# Patient Record
Sex: Female | Born: 1945 | Race: White | Hispanic: No | Marital: Married | Smoking: Never smoker
Health system: Southern US, Community
[De-identification: ages and names within clinical notes are randomized; demographics above are authoritative.]

## PROBLEM LIST (undated history)

## (undated) DIAGNOSIS — M8580 Other specified disorders of bone density and structure, unspecified site: Secondary | ICD-10-CM

## (undated) DIAGNOSIS — N200 Calculus of kidney: Secondary | ICD-10-CM

## (undated) DIAGNOSIS — C50919 Malignant neoplasm of unspecified site of unspecified female breast: Secondary | ICD-10-CM

## (undated) DIAGNOSIS — C439 Malignant melanoma of skin, unspecified: Secondary | ICD-10-CM

## (undated) DIAGNOSIS — M1990 Unspecified osteoarthritis, unspecified site: Secondary | ICD-10-CM

## (undated) DIAGNOSIS — K219 Gastro-esophageal reflux disease without esophagitis: Secondary | ICD-10-CM

## (undated) DIAGNOSIS — C4491 Basal cell carcinoma of skin, unspecified: Secondary | ICD-10-CM

## (undated) DIAGNOSIS — Z9221 Personal history of antineoplastic chemotherapy: Secondary | ICD-10-CM

## (undated) HISTORY — DX: Malignant melanoma of skin, unspecified: C43.9

## (undated) HISTORY — DX: Gastro-esophageal reflux disease without esophagitis: K21.9

## (undated) HISTORY — DX: Basal cell carcinoma of skin, unspecified: C44.91

## (undated) HISTORY — PX: COLONOSCOPY W/ BIOPSIES AND POLYPECTOMY: SHX1376

## (undated) HISTORY — PX: MASTECTOMY: SHX3

## (undated) HISTORY — PX: LASIK: SHX215

## (undated) HISTORY — DX: Malignant neoplasm of unspecified site of unspecified female breast: C50.919

## (undated) HISTORY — PX: EYE SURGERY: SHX253

## (undated) HISTORY — DX: Calculus of kidney: N20.0

## (undated) HISTORY — DX: Unspecified osteoarthritis, unspecified site: M19.90

## (undated) HISTORY — DX: Other specified disorders of bone density and structure, unspecified site: M85.80

---

## 1977-09-05 HISTORY — PX: TUBAL LIGATION: SHX77

## 1986-09-05 HISTORY — PX: BREAST SURGERY: SHX581

## 1987-07-07 DIAGNOSIS — C50919 Malignant neoplasm of unspecified site of unspecified female breast: Secondary | ICD-10-CM

## 1987-07-07 HISTORY — DX: Malignant neoplasm of unspecified site of unspecified female breast: C50.919

## 1998-02-24 ENCOUNTER — Ambulatory Visit: Admission: RE | Admit: 1998-02-24 | Discharge: 1998-02-24 | Payer: Self-pay | Admitting: Oncology

## 1998-03-26 ENCOUNTER — Other Ambulatory Visit: Admission: RE | Admit: 1998-03-26 | Discharge: 1998-03-26 | Payer: Self-pay | Admitting: Obstetrics and Gynecology

## 1998-06-12 ENCOUNTER — Encounter: Payer: Self-pay | Admitting: Gastroenterology

## 1998-06-12 ENCOUNTER — Ambulatory Visit (HOSPITAL_COMMUNITY): Admission: RE | Admit: 1998-06-12 | Discharge: 1998-06-12 | Payer: Self-pay | Admitting: Gastroenterology

## 1998-09-30 ENCOUNTER — Ambulatory Visit (HOSPITAL_COMMUNITY): Admission: RE | Admit: 1998-09-30 | Discharge: 1998-09-30 | Payer: Self-pay | Admitting: Gastroenterology

## 1998-09-30 ENCOUNTER — Encounter: Payer: Self-pay | Admitting: Gastroenterology

## 1999-03-01 ENCOUNTER — Ambulatory Visit (HOSPITAL_COMMUNITY): Admission: RE | Admit: 1999-03-01 | Discharge: 1999-03-01 | Payer: Self-pay | Admitting: Oncology

## 1999-03-23 ENCOUNTER — Other Ambulatory Visit: Admission: RE | Admit: 1999-03-23 | Discharge: 1999-03-23 | Payer: Self-pay | Admitting: Obstetrics and Gynecology

## 2000-03-13 ENCOUNTER — Encounter: Admission: RE | Admit: 2000-03-13 | Discharge: 2000-03-13 | Payer: Self-pay | Admitting: Oncology

## 2000-03-13 ENCOUNTER — Encounter: Payer: Self-pay | Admitting: Oncology

## 2000-03-22 ENCOUNTER — Other Ambulatory Visit: Admission: RE | Admit: 2000-03-22 | Discharge: 2000-03-22 | Payer: Self-pay | Admitting: Obstetrics & Gynecology

## 2000-08-14 ENCOUNTER — Encounter: Payer: Self-pay | Admitting: Urology

## 2000-08-14 ENCOUNTER — Encounter: Admission: RE | Admit: 2000-08-14 | Discharge: 2000-08-14 | Payer: Self-pay | Admitting: Urology

## 2001-03-20 ENCOUNTER — Encounter: Admission: RE | Admit: 2001-03-20 | Discharge: 2001-03-20 | Payer: Self-pay | Admitting: *Deleted

## 2001-03-20 ENCOUNTER — Encounter: Payer: Self-pay | Admitting: *Deleted

## 2001-03-21 ENCOUNTER — Other Ambulatory Visit: Admission: RE | Admit: 2001-03-21 | Discharge: 2001-03-21 | Payer: Self-pay | Admitting: *Deleted

## 2002-03-21 ENCOUNTER — Encounter: Admission: RE | Admit: 2002-03-21 | Discharge: 2002-03-21 | Payer: Self-pay | Admitting: Oncology

## 2002-03-21 ENCOUNTER — Encounter: Payer: Self-pay | Admitting: Oncology

## 2002-12-05 DIAGNOSIS — C439 Malignant melanoma of skin, unspecified: Secondary | ICD-10-CM

## 2002-12-05 HISTORY — DX: Malignant melanoma of skin, unspecified: C43.9

## 2003-01-08 ENCOUNTER — Encounter (INDEPENDENT_AMBULATORY_CARE_PROVIDER_SITE_OTHER): Payer: Self-pay | Admitting: Specialist

## 2003-01-08 ENCOUNTER — Ambulatory Visit (HOSPITAL_BASED_OUTPATIENT_CLINIC_OR_DEPARTMENT_OTHER): Admission: RE | Admit: 2003-01-08 | Discharge: 2003-01-08 | Payer: Self-pay | Admitting: Plastic Surgery

## 2003-01-17 ENCOUNTER — Encounter: Payer: Self-pay | Admitting: Dermatology

## 2003-01-17 ENCOUNTER — Ambulatory Visit (HOSPITAL_COMMUNITY): Admission: RE | Admit: 2003-01-17 | Discharge: 2003-01-17 | Payer: Self-pay | Admitting: Dermatology

## 2003-02-21 ENCOUNTER — Ambulatory Visit (HOSPITAL_COMMUNITY): Admission: RE | Admit: 2003-02-21 | Discharge: 2003-02-21 | Payer: Self-pay | Admitting: Gastroenterology

## 2003-02-21 ENCOUNTER — Encounter (INDEPENDENT_AMBULATORY_CARE_PROVIDER_SITE_OTHER): Payer: Self-pay | Admitting: *Deleted

## 2003-03-24 ENCOUNTER — Encounter: Admission: RE | Admit: 2003-03-24 | Discharge: 2003-03-24 | Payer: Self-pay | Admitting: Oncology

## 2003-03-24 ENCOUNTER — Encounter: Payer: Self-pay | Admitting: Oncology

## 2003-04-21 ENCOUNTER — Other Ambulatory Visit: Admission: RE | Admit: 2003-04-21 | Discharge: 2003-04-21 | Payer: Self-pay | Admitting: Internal Medicine

## 2004-03-24 ENCOUNTER — Encounter: Admission: RE | Admit: 2004-03-24 | Discharge: 2004-03-24 | Payer: Self-pay | Admitting: Oncology

## 2004-06-28 ENCOUNTER — Other Ambulatory Visit: Admission: RE | Admit: 2004-06-28 | Discharge: 2004-06-28 | Payer: Self-pay | Admitting: Internal Medicine

## 2005-03-31 ENCOUNTER — Encounter: Admission: RE | Admit: 2005-03-31 | Discharge: 2005-03-31 | Payer: Self-pay | Admitting: Internal Medicine

## 2005-09-22 ENCOUNTER — Other Ambulatory Visit: Admission: RE | Admit: 2005-09-22 | Discharge: 2005-09-22 | Payer: Self-pay | Admitting: Internal Medicine

## 2005-11-04 ENCOUNTER — Encounter: Admission: RE | Admit: 2005-11-04 | Discharge: 2005-11-04 | Payer: Self-pay | Admitting: Internal Medicine

## 2006-01-04 ENCOUNTER — Encounter: Payer: Self-pay | Admitting: Oncology

## 2006-04-13 ENCOUNTER — Encounter: Admission: RE | Admit: 2006-04-13 | Discharge: 2006-04-13 | Payer: Self-pay | Admitting: Internal Medicine

## 2007-02-09 ENCOUNTER — Other Ambulatory Visit: Admission: RE | Admit: 2007-02-09 | Discharge: 2007-02-09 | Payer: Self-pay | Admitting: Obstetrics and Gynecology

## 2007-04-06 DIAGNOSIS — M8580 Other specified disorders of bone density and structure, unspecified site: Secondary | ICD-10-CM

## 2007-04-06 HISTORY — DX: Other specified disorders of bone density and structure, unspecified site: M85.80

## 2007-04-16 ENCOUNTER — Encounter: Admission: RE | Admit: 2007-04-16 | Discharge: 2007-04-16 | Payer: Self-pay | Admitting: Obstetrics and Gynecology

## 2007-04-20 ENCOUNTER — Encounter: Admission: RE | Admit: 2007-04-20 | Discharge: 2007-04-20 | Payer: Self-pay | Admitting: Obstetrics and Gynecology

## 2008-02-19 ENCOUNTER — Other Ambulatory Visit: Admission: RE | Admit: 2008-02-19 | Discharge: 2008-02-19 | Payer: Self-pay | Admitting: Obstetrics and Gynecology

## 2008-04-22 ENCOUNTER — Encounter: Admission: RE | Admit: 2008-04-22 | Discharge: 2008-04-22 | Payer: Self-pay | Admitting: Obstetrics and Gynecology

## 2008-09-09 ENCOUNTER — Encounter: Admission: RE | Admit: 2008-09-09 | Discharge: 2008-09-09 | Payer: Self-pay | Admitting: Orthopedic Surgery

## 2009-04-23 ENCOUNTER — Encounter: Admission: RE | Admit: 2009-04-23 | Discharge: 2009-04-23 | Payer: Self-pay | Admitting: Obstetrics and Gynecology

## 2009-04-27 ENCOUNTER — Encounter: Admission: RE | Admit: 2009-04-27 | Discharge: 2009-04-27 | Payer: Self-pay | Admitting: Obstetrics and Gynecology

## 2009-06-09 ENCOUNTER — Inpatient Hospital Stay (HOSPITAL_COMMUNITY): Admission: RE | Admit: 2009-06-09 | Discharge: 2009-06-11 | Payer: Self-pay | Admitting: Orthopedic Surgery

## 2009-07-06 HISTORY — PX: LUMBAR SPINE SURGERY: SHX701

## 2010-04-26 ENCOUNTER — Encounter: Admission: RE | Admit: 2010-04-26 | Discharge: 2010-04-26 | Payer: Self-pay | Admitting: Obstetrics and Gynecology

## 2010-09-26 ENCOUNTER — Encounter: Payer: Self-pay | Admitting: Obstetrics and Gynecology

## 2010-12-09 LAB — TYPE AND SCREEN: ABO/RH(D): A POS

## 2010-12-10 LAB — CBC
Hemoglobin: 13.2 g/dL (ref 12.0–15.0)
MCHC: 33.9 g/dL (ref 30.0–36.0)
MCV: 102.1 fL — ABNORMAL HIGH (ref 78.0–100.0)
WBC: 7.6 10*3/uL (ref 4.0–10.5)

## 2010-12-10 LAB — COMPREHENSIVE METABOLIC PANEL
ALT: 14 U/L (ref 0–35)
AST: 26 U/L (ref 0–37)
Alkaline Phosphatase: 30 U/L — ABNORMAL LOW (ref 39–117)
CO2: 27 mEq/L (ref 19–32)
Chloride: 108 mEq/L (ref 96–112)
GFR calc Af Amer: >60 mL/min (ref >60)
GFR calc non Af Amer: >60 mL/min (ref >60)
Total Protein: 7.4 g/dL (ref 6.0–8.3)

## 2010-12-10 LAB — APTT: aPTT: 25 seconds (ref 24–37)

## 2010-12-10 LAB — DIFFERENTIAL
Basophils Absolute: 0 10*3/uL (ref 0.0–0.1)
Basophils Relative: 0 % (ref 0–1)
Eosinophils Absolute: 0.1 10*3/uL (ref 0.0–0.7)
Eosinophils Relative: 1 % (ref 0–5)
Lymphocytes Relative: 26 % (ref 12–46)
Lymphs Abs: 1.9 10*3/uL (ref 0.7–4.0)
Monocytes Relative: 8 % (ref 3–12)
Neutro Abs: 5 10*3/uL (ref 1.7–7.7)

## 2010-12-10 LAB — URINALYSIS, ROUTINE W REFLEX MICROSCOPIC
Hgb urine dipstick: NEGATIVE
Ketones, ur: NEGATIVE mg/dL
Protein, ur: NEGATIVE mg/dL
Specific Gravity, Urine: 1.011 (ref 1.005–1.030)
pH: 7.5 (ref 5.0–8.0)

## 2010-12-10 LAB — URINE MICROSCOPIC-ADD ON

## 2011-01-21 NOTE — Op Note (Signed)
   NAME:  Jeanette Gonzalez, Jeanette Gonzalez                     ACCOUNT NO.:  [id number]   MEDICAL RECORD NO.:  [id number]                   PATIENT TYPE:  AMB   LOCATION:  ENDO                                 FACILITY:  [facility]   PHYSICIAN:  Petra Kuba, M.D.                 DATE OF BIRTH:  [birth year]   DATE OF PROCEDURE:  02/21/2003  DATE OF DISCHARGE:  02/21/2003                                 OPERATIVE REPORT   PROCEDURE PERFORMED:  Colonoscopy with biopsy.   ENDOSCOPIST:  Petra Kuba, M.D.   INDICATIONS FOR PROCEDURE:  Patient with history of colon polyps.  Due for  repeat screening.  Consent was signed after the risks, benefits, methods and  options were thoroughly discussed in the office.   MEDICINES USED:  Demerol 75 mg, Versed 7 mg.   DESCRIPTION OF PROCEDURE:  Rectal inspection was pertinent for external  hemorrhoids, small.  Digital exam was negative.  A video pediatric  adjustable colonoscope was inserted with some difficulty due to a tortuous  colon and was able to be advanced to the cecum.  This did require some  abdominal pressure, but no position changes.  No obvious abnormality was  seen on insertion.  The cecum was identified by the appendiceal orifice and  the ileocecal valve.  The scope was slowly withdrawn.  The prep was fairly  adequate.  She did have lots of seeds in the colon which could not be  suctioned up and occasionally clogged the scope and she was tortuous.  On  slow withdrawal through the colon no abnormalities were seen until we  withdrew back to the rectum where a tiny polyp was seen probably hypoplastic  which was cold biopsied times two.  Anorectal pull-through and retroflexion  confirmed some small hemorrhoids.  The scope was reinserted a short ways up  the left side of the colon.  Air was suctioned, scope removed.  The patient  tolerated the procedure well.  There was no immediate obvious complication.   ENDOSCOPIC DIAGNOSIS:  1. Internal and external  hemorrhoids.  2. Tiny rectal polyp, cold biopsied.  3. Tortuous colon.  4. Otherwise within normal limits to the cecum.   PLAN:  Yearly rectals and guaiacs per Dr. Delrae Alfred or gynecology.  Happy to  see back p.r.n.  Otherwise await pathology.  Probably recheck colon  screening in five years.                                               Petra Kuba, M.D.   MEM/MEDQ  D:  02/21/2003  T:  02/24/2003  Job:  [id number]   cc:   Marcene Duos, M.D.  [address] [place]  [place] [address]  Fax: [phone]

## 2011-01-21 NOTE — Op Note (Signed)
   NAME:  Jeanette Gonzalez, BONES                     ACCOUNT NO.:  [id number]   MEDICAL RECORD NO.:  [id number]                   PATIENT TYPE:  AMB   LOCATION:  DSC                                  FACILITY:  [facility]   PHYSICIAN:  Alfredia Ferguson, M.D.               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   OPERATION PERFORMED:  Wide excision with 1.5 cm margins of malignant  melanoma, right elbow.   SURGEON:  Alfredia Ferguson, M.D.   ANESTHESIA:  MAC supplemented 1% Xylocaine 1:100,000 epinephrine.   INDICATIONS FOR PROCEDURE:  The patient is a 65 year old woman with a recent  diagnosis of malignant melanoma of the right elbow area of the arm.  It is  overlying the brachioradialis muscle.  The patient wishes to undergo wide  excision.  I believe there is adequate amount of tissue to excise the area  and get a primary closure.  The potential risks of positive margins,  recurrence of the disease, infection, bleeding, unsightly scarring and  overall dissatisfaction with the results were discussed with the patient.  In spite of that she wished to proceed with the operation.   DESCRIPTION OF PROCEDURE:  After adequate intravenous analgesia had been  given.  Skin markers were placed outlining the melanoma with a minimum of  1.5 cm margins.  Local anesthesia was infiltrated and the arm was prepped  with Betadine and draped with sterile drapes.  After waiting approximately  10 minutes, an elliptical excision of the lesion down to the level of the  fascia over the brachioradialis muscle was carried out.  The  specimen was  passed off for pathology.  Hemostasis was maintained throughout using  electrocautery.  Wound edges were undermined for a distance of 1 cm in all  directions.  The wound was closed by approximating the dermis using multiple  interrupted 3-0 Monocryl sutures.  The skin was united using a running 4-0  nylon suture.  A bulky arm dressing was placed.  The estimated blood loss  was minimal.  The patient was then transferred to the recovery room in  satisfactory condition.                                                Alfredia Ferguson, M.D.    WBB/MEDQ  D:  01/08/2003  T:  01/08/2003  Job:  [id number]

## 2011-04-27 ENCOUNTER — Other Ambulatory Visit: Payer: Self-pay | Admitting: Obstetrics and Gynecology

## 2011-04-27 DIAGNOSIS — Z9011 Acquired absence of right breast and nipple: Secondary | ICD-10-CM

## 2011-04-27 DIAGNOSIS — Z780 Asymptomatic menopausal state: Secondary | ICD-10-CM

## 2011-05-03 ENCOUNTER — Ambulatory Visit
Admission: RE | Admit: 2011-05-03 | Discharge: 2011-05-03 | Disposition: A | Discharge status: Home / Self Care | Payer: BC Managed Care – PPO | Source: Ambulatory Visit | Attending: Obstetrics and Gynecology | Admitting: Obstetrics and Gynecology

## 2011-05-03 DIAGNOSIS — Z780 Asymptomatic menopausal state: Secondary | ICD-10-CM

## 2011-05-03 DIAGNOSIS — Z9011 Acquired absence of right breast and nipple: Secondary | ICD-10-CM

## 2012-06-04 ENCOUNTER — Other Ambulatory Visit: Payer: Self-pay | Admitting: Obstetrics and Gynecology

## 2012-06-04 DIAGNOSIS — Z1231 Encounter for screening mammogram for malignant neoplasm of breast: Secondary | ICD-10-CM

## 2012-06-04 DIAGNOSIS — Z9011 Acquired absence of right breast and nipple: Secondary | ICD-10-CM

## 2012-06-21 ENCOUNTER — Ambulatory Visit
Admission: RE | Admit: 2012-06-21 | Discharge: 2012-06-21 | Disposition: A | Discharge status: Home / Self Care | Payer: BC Managed Care – PPO | Source: Ambulatory Visit | Attending: Obstetrics and Gynecology | Admitting: Obstetrics and Gynecology

## 2012-06-21 DIAGNOSIS — Z1231 Encounter for screening mammogram for malignant neoplasm of breast: Secondary | ICD-10-CM

## 2012-06-21 DIAGNOSIS — Z9011 Acquired absence of right breast and nipple: Secondary | ICD-10-CM

## 2013-03-27 ENCOUNTER — Encounter: Payer: Self-pay | Admitting: Obstetrics and Gynecology

## 2013-03-27 ENCOUNTER — Ambulatory Visit (INDEPENDENT_AMBULATORY_CARE_PROVIDER_SITE_OTHER): Payer: Medicare PPO | Admitting: Obstetrics and Gynecology

## 2013-03-27 VITALS — BP 122/64 | HR 72 | Resp 18 | Ht 66.5 in | Wt 153.0 lb

## 2013-03-27 DIAGNOSIS — Z0000 Encounter for general adult medical examination without abnormal findings: Secondary | ICD-10-CM

## 2013-03-27 DIAGNOSIS — Z01419 Encounter for gynecological examination (general) (routine) without abnormal findings: Secondary | ICD-10-CM

## 2013-03-27 LAB — POCT URINALYSIS DIPSTICK
Blood, UA: NEGATIVE
Nitrite, UA: NEGATIVE
Urobilinogen, UA: NEGATIVE
pH, UA: 7

## 2013-03-27 MED ORDER — VITAMIN D (ERGOCALCIFEROL) 1.25 MG (50000 UNIT) PO CAPS: 50000.0000 [IU] | ORAL_CAPSULE | ORAL | Status: DC

## 2013-03-27 MED ORDER — MEGESTROL ACETATE 20 MG PO TABS: 20.0000 mg | ORAL_TABLET | Freq: Two times a day (BID) | ORAL | Status: DC

## 2013-03-27 NOTE — Patient Instructions (Signed)

## 2013-03-27 NOTE — Progress Notes (Signed)
67 y.o.   Married    Caucasian   female   G1P1001   here for annual exam.  Stopped her megace last winter but then hot flashes resumed with "a vengence"  So went back on  No LMP recorded. Patient is postmenopausal.          Sexually active: yes  The current method of family planning is tubal ligation and post menopausal status.    Exercising: occ cardio, weights at [employer] Last mammogram:  06/21/12 normal Last pap smear:03/09/10 neg History of abnormal pap: no Smoking: no Alcohol: no Last colonoscopy 2009 normal, repeat in 5 year, due to polyps in 2004 Last Bone Density:  05/03/11 osteopenia Last tetanus shot: 04/25/2007 Last cholesterol check: not sure  Hgb:   13.0             Urine: neg   Family History  Problem Relation Age of Onset  . Stroke Mother   . Cancer Father     There are no active problems to display for this patient.   Past Medical History  Diagnosis Date  . Osteopenia 04/2007  . Arthritis     spine  . Kidney stones   . GERD (gastroesophageal reflux disease)   . Malignant melanoma 12/2002    right elbow  . Basal cell carcinoma     x 2  . Breast cancer 07/1987    Past Surgical History  Procedure Laterality Date  . Breast surgery Right 1988    mastectomy, chemo  . Tubal ligation    . Lasik    . Lumbar spine surgery  07/2009    repair lumbar spinal stenosis    Allergies: Review of patient's allergies indicates no known allergies.  Current Outpatient Prescriptions  Medication Sig Dispense Refill  . Calcium Carbonate-Vitamin D (CALCIUM + D PO) Take 600 mg by mouth 3 (three) times daily.      . Ergocalciferol (VITAMIN D2 PO) Take 1.25 mg by mouth every 14 (fourteen) days.      . megestrol (MEGACE) 20 MG tablet 2 (two) times daily.       . Multiple Vitamin (MULTIVITAMIN) tablet Take 1 tablet by mouth daily.       No current facility-administered medications for this visit.    ROS: Pertinent items are noted in HPI.  Social Hx:  Married, one  child, retired  Exam:    BP 122/64  Pulse 72  Resp 18  Ht 5' 6.5" (1.689 m)  Wt 153 lb (69.4 kg)  BMI 24.33 kg/m2  Ht stable, weight up 9 pounds since last year Wt Readings from Last 3 Encounters:  03/27/13 153 lb (69.4 kg)     Ht Readings from Last 3 Encounters:  03/27/13 5' 6.5" (1.689 m)    General appearance: alert, cooperative and appears stated age Head: Normocephalic, without obvious abnormality, atraumatic Neck: no adenopathy, supple, symmetrical, trachea midline and thyroid not enlarged, symmetric, no tenderness/mass/nodules Lungs: clear to auscultation bilaterally Breasts: Inspection negative, No nipple retraction or dimpling, No nipple discharge or bleeding, No axillary or supraclavicular adenopathy, Normal to palpation without dominant masses Heart: regular rate and rhythm Abdomen: soft, non-tender; bowel sounds normal; no masses,  no organomegaly Extremities: extremities normal, atraumatic, no cyanosis or edema Skin: Skin color, texture, turgor normal. No rashes or lesions Lymph nodes: Cervical, supraclavicular, and axillary nodes normal. No abnormal inguinal nodes palpated Neurologic: Grossly normal   Pelvic: External genitalia:  no lesions  Urethra:  normal appearing urethra with no masses, tenderness or lesions              Bartholins and Skenes: normal                 Vagina: normal appearing vagina with normal color and discharge, no lesions              Cervix: normal appearance              Pap taken: no        Bimanual Exam:  Uterus:  uterus is normal size, shape, consistency and nontender                                      Adnexa: normal adnexa in size, nontender and no masses                                      Rectovaginal: Confirms                                      Anus:  normal sphincter tone, no lesions  A: normal menopausal exam, no HRT     Right breast cancer 1988, mastectomy, chemo     Osteopenia, quit fosamax July 2011 after  at least 10 years on it      On megace for hot flashes - miserable without it     malig melanoma right elbow     Kidney stones     Repair lumbar spinal stenosis 2010     P:     mammogram counseled on breast self exam, mammography screening, adequate intake of calcium and vitamin D, diet and exercise return annually or prn     An After Visit Summary was printed and given to the patient.

## 2013-06-04 ENCOUNTER — Other Ambulatory Visit: Payer: Self-pay

## 2013-06-04 DIAGNOSIS — Z1231 Encounter for screening mammogram for malignant neoplasm of breast: Secondary | ICD-10-CM

## 2013-06-25 ENCOUNTER — Other Ambulatory Visit: Payer: Self-pay

## 2013-06-25 ENCOUNTER — Ambulatory Visit
Admission: RE | Admit: 2013-06-25 | Discharge: 2013-06-25 | Disposition: A | Discharge status: Home / Self Care | Payer: Medicare PPO | Source: Ambulatory Visit

## 2013-06-25 DIAGNOSIS — Z1231 Encounter for screening mammogram for malignant neoplasm of breast: Secondary | ICD-10-CM

## 2013-12-18 ENCOUNTER — Encounter: Payer: Self-pay | Admitting: Obstetrics and Gynecology

## 2014-03-28 ENCOUNTER — Ambulatory Visit: Payer: Medicare PPO | Admitting: Obstetrics and Gynecology

## 2014-04-11 ENCOUNTER — Ambulatory Visit: Payer: Medicare PPO | Admitting: Obstetrics and Gynecology

## 2014-06-04 ENCOUNTER — Other Ambulatory Visit: Payer: Self-pay

## 2014-06-04 DIAGNOSIS — Z9011 Acquired absence of right breast and nipple: Secondary | ICD-10-CM

## 2014-06-04 DIAGNOSIS — Z1231 Encounter for screening mammogram for malignant neoplasm of breast: Secondary | ICD-10-CM

## 2014-07-07 ENCOUNTER — Other Ambulatory Visit: Payer: Self-pay | Admitting: Neurosurgery

## 2014-07-07 ENCOUNTER — Encounter: Payer: Self-pay | Admitting: Obstetrics and Gynecology

## 2014-07-07 DIAGNOSIS — M419 Scoliosis, unspecified: Secondary | ICD-10-CM

## 2014-07-08 ENCOUNTER — Ambulatory Visit
Admission: RE | Admit: 2014-07-08 | Discharge: 2014-07-08 | Disposition: A | Discharge status: Home / Self Care | Payer: Medicare PPO | Source: Ambulatory Visit

## 2014-07-08 DIAGNOSIS — Z1231 Encounter for screening mammogram for malignant neoplasm of breast: Secondary | ICD-10-CM

## 2014-07-08 DIAGNOSIS — Z9011 Acquired absence of right breast and nipple: Secondary | ICD-10-CM

## 2014-07-17 ENCOUNTER — Ambulatory Visit
Admission: RE | Admit: 2014-07-17 | Discharge: 2014-07-17 | Disposition: A | Discharge status: Home / Self Care | Payer: Medicare PPO | Source: Ambulatory Visit | Attending: Neurosurgery | Admitting: Neurosurgery

## 2014-07-17 DIAGNOSIS — M419 Scoliosis, unspecified: Secondary | ICD-10-CM

## 2014-07-17 MED ORDER — DIAZEPAM 5 MG PO TABS
10.0000 mg | ORAL_TABLET | Freq: Once | ORAL | Status: AC
  Administered 2014-07-17: 10 mg via ORAL

## 2014-07-17 MED ORDER — IOHEXOL 180 MG/ML  SOLN
15.0000 mL | Freq: Once | INTRAMUSCULAR | Status: AC | PRN
  Administered 2014-07-17: 15 mL via INTRATHECAL

## 2014-07-17 NOTE — Discharge Instructions (Signed)

## 2014-09-08 ENCOUNTER — Other Ambulatory Visit: Payer: Self-pay | Admitting: Neurosurgery

## 2014-09-24 NOTE — Pre-Procedure Instructions (Signed)
Jeanette Gonzalez  09/24/2014   Your procedure is scheduled on:  10-02-2014   Thursday   Report to [facility] Admitting at 5:30 AM.   Call this number if you have problems the morning of surgery: [phone]   Remember:   Do not eat food or drink liquids after midnight.    Take these medicines the morning of surgery with A SIP OF WATER: none   Do not wear jewelry, make-up or nail polish.  Do not wear lotions, powders, or perfumes. You may not wear deodorant.  Do not shave 48 hours prior to surgery. .  Do not bring valuables to the hospital.  [facility] is not responsible  for any belongings or valuables.               Contacts, dentures or bridgework may not be worn into surgery.   Leave suitcase in the car. After surgery it may be brought to your room.   For patients admitted to the hospital, discharge time is determined by your  treatment team.               .    Special Instructions: See attached sheet for instructions on CHG shower/bath     Please read over the following fact sheets that you were given: Pain Booklet, Coughing and Deep Breathing, Blood Transfusion Information and Surgical Site Infection Prevention

## 2014-09-25 ENCOUNTER — Encounter (HOSPITAL_COMMUNITY)
Admission: RE | Admit: 2014-09-25 | Discharge: 2014-09-25 | Disposition: A | Discharge status: Home / Self Care | Payer: Medicare PPO | Source: Ambulatory Visit | Attending: Neurosurgery | Admitting: Neurosurgery

## 2014-09-25 ENCOUNTER — Encounter (HOSPITAL_COMMUNITY): Payer: Self-pay

## 2014-09-25 DIAGNOSIS — Z01818 Encounter for other preprocedural examination: Secondary | ICD-10-CM | POA: Diagnosis not present

## 2014-09-25 DIAGNOSIS — M545 Low back pain: Secondary | ICD-10-CM | POA: Insufficient documentation

## 2014-09-25 LAB — CBC
HCT: 38.8 % (ref 36.0–46.0)
Hemoglobin: 13 g/dL (ref 12.0–15.0)
MCH: 32.6 pg (ref 26.0–34.0)
MCHC: 33.5 g/dL (ref 30.0–36.0)
MCV: 97.2 fL (ref 78.0–100.0)
Platelets: 232 10*3/uL (ref 150–400)
RBC: 3.99 MIL/uL (ref 3.87–5.11)
RDW: 13.1 % (ref 11.5–15.5)
WBC: 4.2 10*3/uL (ref 4.0–10.5)

## 2014-09-25 LAB — BASIC METABOLIC PANEL
Anion gap: 9 (ref 5–15)
BUN: 10 mg/dL (ref 6–23)
CO2: 28 mmol/L (ref 19–32)
CREATININE: 0.87 mg/dL (ref 0.50–1.10)
Calcium: 9.8 mg/dL (ref 8.4–10.5)
Chloride: 104 mEq/L (ref 96–112)
GFR calc non Af Amer: 66 mL/min — ABNORMAL LOW (ref >90)
GFR, EST AFRICAN AMERICAN: 77 mL/min — AB (ref >90)
Glucose, Bld: 95 mg/dL (ref 70–99)
Potassium: 4 mmol/L (ref 3.5–5.1)
Sodium: 141 mmol/L (ref 135–145)

## 2014-09-25 LAB — SURGICAL PCR SCREEN
MRSA, PCR: NEGATIVE
STAPHYLOCOCCUS AUREUS: POSITIVE — AB

## 2014-09-25 NOTE — Progress Notes (Signed)
Patient made aware that nasal swab tested positive for staph and script called to her pharmacy. Patient verbalized understanding of instructions.

## 2014-10-01 MED ORDER — DEXAMETHASONE SODIUM PHOSPHATE 10 MG/ML IJ SOLN
10.0000 mg | INTRAMUSCULAR | Status: AC
  Administered 2014-10-02: 10 mg via INTRAVENOUS
  Filled 2014-10-01: qty 1

## 2014-10-01 MED ORDER — VANCOMYCIN HCL IN DEXTROSE 1-5 GM/200ML-% IV SOLN
1000.0000 mg | INTRAVENOUS | Status: AC
  Administered 2014-10-02: 1000 mg via INTRAVENOUS
  Filled 2014-10-01: qty 200

## 2014-10-02 ENCOUNTER — Inpatient Hospital Stay (HOSPITAL_COMMUNITY)
Admission: RE | Admit: 2014-10-02 | Discharge: 2014-10-08 | DRG: 458 | Disposition: A | Discharge status: Home / Self Care | Payer: Medicare PPO | Source: Ambulatory Visit | Attending: Neurosurgery | Admitting: Neurosurgery

## 2014-10-02 ENCOUNTER — Inpatient Hospital Stay (HOSPITAL_COMMUNITY): Payer: Medicare PPO | Admitting: Anesthesiology

## 2014-10-02 ENCOUNTER — Encounter (HOSPITAL_COMMUNITY): Payer: Self-pay | Admitting: Certified Registered"

## 2014-10-02 ENCOUNTER — Encounter (HOSPITAL_COMMUNITY)
Admission: RE | Disposition: A | Discharge status: Home / Self Care | Payer: Self-pay | Source: Ambulatory Visit | Attending: Neurosurgery

## 2014-10-02 ENCOUNTER — Inpatient Hospital Stay (HOSPITAL_COMMUNITY): Payer: Medicare PPO

## 2014-10-02 DIAGNOSIS — M549 Dorsalgia, unspecified: Secondary | ICD-10-CM | POA: Diagnosis present

## 2014-10-02 DIAGNOSIS — Z853 Personal history of malignant neoplasm of breast: Secondary | ICD-10-CM

## 2014-10-02 DIAGNOSIS — Z79899 Other long term (current) drug therapy: Secondary | ICD-10-CM

## 2014-10-02 DIAGNOSIS — Z9221 Personal history of antineoplastic chemotherapy: Secondary | ICD-10-CM | POA: Diagnosis not present

## 2014-10-02 DIAGNOSIS — M4807 Spinal stenosis, lumbosacral region: Secondary | ICD-10-CM | POA: Diagnosis present

## 2014-10-02 DIAGNOSIS — M4317 Spondylolisthesis, lumbosacral region: Secondary | ICD-10-CM | POA: Diagnosis not present

## 2014-10-02 DIAGNOSIS — Z419 Encounter for procedure for purposes other than remedying health state, unspecified: Secondary | ICD-10-CM

## 2014-10-02 DIAGNOSIS — Z85820 Personal history of malignant melanoma of skin: Secondary | ICD-10-CM

## 2014-10-02 DIAGNOSIS — M419 Scoliosis, unspecified: Secondary | ICD-10-CM | POA: Diagnosis present

## 2014-10-02 DIAGNOSIS — M4187 Other forms of scoliosis, lumbosacral region: Principal | ICD-10-CM | POA: Diagnosis present

## 2014-10-02 DIAGNOSIS — M4126 Other idiopathic scoliosis, lumbar region: Secondary | ICD-10-CM | POA: Diagnosis not present

## 2014-10-02 DIAGNOSIS — Z85828 Personal history of other malignant neoplasm of skin: Secondary | ICD-10-CM | POA: Diagnosis not present

## 2014-10-02 SURGERY — POSTERIOR LUMBAR FUSION 2 LEVEL
Anesthesia: General | Site: Spine Lumbar

## 2014-10-02 MED ORDER — DEXAMETHASONE 4 MG PO TABS
4.0000 mg | ORAL_TABLET | Freq: Four times a day (QID) | ORAL | Status: AC
  Administered 2014-10-02: 4 mg via ORAL
  Filled 2014-10-02: qty 1

## 2014-10-02 MED ORDER — CYCLOBENZAPRINE HCL 10 MG PO TABS: 10.0000 mg | ORAL_TABLET | Freq: Three times a day (TID) | ORAL | Status: DC | PRN

## 2014-10-02 MED ORDER — THROMBIN 20000 UNITS EX SOLR
CUTANEOUS | Status: DC | PRN
  Administered 2014-10-02 (×2): via TOPICAL

## 2014-10-02 MED ORDER — SUCCINYLCHOLINE CHLORIDE 20 MG/ML IJ SOLN
INTRAMUSCULAR | Status: AC
  Filled 2014-10-02: qty 1

## 2014-10-02 MED ORDER — ARTIFICIAL TEARS OP OINT
TOPICAL_OINTMENT | OPHTHALMIC | Status: AC
  Filled 2014-10-02: qty 3.5

## 2014-10-02 MED ORDER — OXYCODONE-ACETAMINOPHEN 5-325 MG PO TABS
1.0000 | ORAL_TABLET | ORAL | Status: DC | PRN
  Administered 2014-10-02: 2 via ORAL
  Administered 2014-10-03 (×5): 1 via ORAL
  Administered 2014-10-04 – 2014-10-05 (×7): 2 via ORAL
  Administered 2014-10-05 (×2): 1 via ORAL
  Filled 2014-10-02: qty 1
  Filled 2014-10-02 (×3): qty 2
  Filled 2014-10-02: qty 1
  Filled 2014-10-02 (×2): qty 2
  Filled 2014-10-02: qty 1
  Filled 2014-10-02 (×2): qty 2
  Filled 2014-10-02: qty 1
  Filled 2014-10-02 (×2): qty 2
  Filled 2014-10-02 (×2): qty 1

## 2014-10-02 MED ORDER — SODIUM CHLORIDE 0.9 % IR SOLN
Status: DC | PRN
  Administered 2014-10-02 (×2): 500 mL

## 2014-10-02 MED ORDER — PANTOPRAZOLE SODIUM 40 MG IV SOLR
40.0000 mg | Freq: Every day | INTRAVENOUS | Status: DC
  Administered 2014-10-02: 40 mg via INTRAVENOUS
  Filled 2014-10-02: qty 40

## 2014-10-02 MED ORDER — BUPIVACAINE HCL (PF) 0.5 % IJ SOLN
INTRAMUSCULAR | Status: DC | PRN
  Administered 2014-10-02: 20 mL

## 2014-10-02 MED ORDER — ACETAMINOPHEN 325 MG PO TABS: 650.0000 mg | ORAL_TABLET | ORAL | Status: DC | PRN

## 2014-10-02 MED ORDER — HYDROMORPHONE HCL 1 MG/ML IJ SOLN
0.2500 mg | INTRAMUSCULAR | Status: DC | PRN
  Administered 2014-10-02 (×2): 0.5 mg via INTRAVENOUS

## 2014-10-02 MED ORDER — ONDANSETRON HCL 4 MG/2ML IJ SOLN
4.0000 mg | INTRAMUSCULAR | Status: DC | PRN
  Administered 2014-10-05 (×2): 4 mg via INTRAVENOUS
  Filled 2014-10-02 (×2): qty 2

## 2014-10-02 MED ORDER — PROPOFOL 10 MG/ML IV BOLUS
INTRAVENOUS | Status: AC
  Filled 2014-10-02: qty 20

## 2014-10-02 MED ORDER — PHENYLEPHRINE HCL 10 MG/ML IJ SOLN
10.0000 mg | INTRAVENOUS | Status: DC | PRN
  Administered 2014-10-02: 20 ug/min via INTRAVENOUS

## 2014-10-02 MED ORDER — MIDAZOLAM HCL 5 MG/5ML IJ SOLN
INTRAMUSCULAR | Status: DC | PRN
  Administered 2014-10-02: 2 mg via INTRAVENOUS

## 2014-10-02 MED ORDER — VANCOMYCIN HCL 500 MG IV SOLR
500.0000 mg | Freq: Two times a day (BID) | INTRAVENOUS | Status: DC
  Administered 2014-10-03 (×2): 500 mg via INTRAVENOUS
  Filled 2014-10-02 (×3): qty 500

## 2014-10-02 MED ORDER — DEXAMETHASONE SODIUM PHOSPHATE 4 MG/ML IJ SOLN
4.0000 mg | Freq: Four times a day (QID) | INTRAMUSCULAR | Status: AC
  Administered 2014-10-03: 4 mg via INTRAVENOUS
  Filled 2014-10-02: qty 1

## 2014-10-02 MED ORDER — MIDAZOLAM HCL 2 MG/2ML IJ SOLN
INTRAMUSCULAR | Status: AC
  Filled 2014-10-02: qty 2

## 2014-10-02 MED ORDER — FENTANYL CITRATE 0.05 MG/ML IJ SOLN
INTRAMUSCULAR | Status: AC
  Filled 2014-10-02: qty 5

## 2014-10-02 MED ORDER — SODIUM CHLORIDE 0.9 % IJ SOLN
INTRAMUSCULAR | Status: AC
  Filled 2014-10-02: qty 10

## 2014-10-02 MED ORDER — 0.9 % SODIUM CHLORIDE (POUR BTL) OPTIME
TOPICAL | Status: DC | PRN
  Administered 2014-10-02: 1000 mL

## 2014-10-02 MED ORDER — SODIUM CHLORIDE 0.9 % IJ SOLN
3.0000 mL | Freq: Two times a day (BID) | INTRAMUSCULAR | Status: DC
  Administered 2014-10-03 – 2014-10-07 (×6): 3 mL via INTRAVENOUS

## 2014-10-02 MED ORDER — PHENYLEPHRINE 40 MCG/ML (10ML) SYRINGE FOR IV PUSH (FOR BLOOD PRESSURE SUPPORT)
PREFILLED_SYRINGE | INTRAVENOUS | Status: AC
  Filled 2014-10-02: qty 10

## 2014-10-02 MED ORDER — PROMETHAZINE HCL 25 MG/ML IJ SOLN
6.2500 mg | INTRAMUSCULAR | Status: DC | PRN
Start: 2014-10-02 — End: 2014-10-02

## 2014-10-02 MED ORDER — NEOSTIGMINE METHYLSULFATE 10 MG/10ML IV SOLN
INTRAVENOUS | Status: DC | PRN
  Administered 2014-10-02: 3 mg via INTRAVENOUS

## 2014-10-02 MED ORDER — PHENOL 1.4 % MT LIQD: 1.0000 | OROMUCOSAL | Status: DC | PRN

## 2014-10-02 MED ORDER — PHENYLEPHRINE HCL 10 MG/ML IJ SOLN
INTRAMUSCULAR | Status: DC | PRN
  Administered 2014-10-02 (×4): 80 ug via INTRAVENOUS
  Administered 2014-10-02 (×2): 40 ug via INTRAVENOUS

## 2014-10-02 MED ORDER — SODIUM CHLORIDE 0.9 % IV SOLN: 250.0000 mL | INTRAVENOUS | Status: DC

## 2014-10-02 MED ORDER — DOCUSATE SODIUM 100 MG PO CAPS
100.0000 mg | ORAL_CAPSULE | Freq: Two times a day (BID) | ORAL | Status: DC
  Administered 2014-10-02 – 2014-10-07 (×10): 100 mg via ORAL
  Filled 2014-10-02 (×11): qty 1

## 2014-10-02 MED ORDER — ALUM & MAG HYDROXIDE-SIMETH 200-200-20 MG/5ML PO SUSP: 30.0000 mL | Freq: Four times a day (QID) | ORAL | Status: DC | PRN

## 2014-10-02 MED ORDER — LIDOCAINE HCL (CARDIAC) 20 MG/ML IV SOLN
INTRAVENOUS | Status: AC
  Filled 2014-10-02: qty 5

## 2014-10-02 MED ORDER — ROCURONIUM BROMIDE 50 MG/5ML IV SOLN
INTRAVENOUS | Status: AC
  Filled 2014-10-02: qty 1

## 2014-10-02 MED ORDER — ACETAMINOPHEN 650 MG RE SUPP: 650.0000 mg | RECTAL | Status: DC | PRN

## 2014-10-02 MED ORDER — EPHEDRINE SULFATE 50 MG/ML IJ SOLN
INTRAMUSCULAR | Status: AC
  Filled 2014-10-02: qty 1

## 2014-10-02 MED ORDER — ARTIFICIAL TEARS OP OINT
TOPICAL_OINTMENT | OPHTHALMIC | Status: DC | PRN
Start: 2014-10-02 — End: 2014-10-02
  Administered 2014-10-02: 1 via OPHTHALMIC

## 2014-10-02 MED ORDER — SODIUM CHLORIDE 0.9 % IJ SOLN: 3.0000 mL | INTRAMUSCULAR | Status: DC | PRN

## 2014-10-02 MED ORDER — GLYCOPYRROLATE 0.2 MG/ML IJ SOLN
INTRAMUSCULAR | Status: DC | PRN
  Administered 2014-10-02: 0.4 mg via INTRAVENOUS

## 2014-10-02 MED ORDER — MENTHOL 3 MG MT LOZG: 1.0000 | LOZENGE | OROMUCOSAL | Status: DC | PRN

## 2014-10-02 MED ORDER — ZOLPIDEM TARTRATE 5 MG PO TABS: 5.0000 mg | ORAL_TABLET | Freq: Every evening | ORAL | Status: DC | PRN

## 2014-10-02 MED ORDER — PROPOFOL 10 MG/ML IV BOLUS
INTRAVENOUS | Status: DC | PRN
  Administered 2014-10-02: 130 mg via INTRAVENOUS

## 2014-10-02 MED ORDER — FENTANYL CITRATE 0.05 MG/ML IJ SOLN
INTRAMUSCULAR | Status: DC | PRN
  Administered 2014-10-02 (×3): 50 ug via INTRAVENOUS

## 2014-10-02 MED ORDER — ROCURONIUM BROMIDE 100 MG/10ML IV SOLN
INTRAVENOUS | Status: DC | PRN
  Administered 2014-10-02: 10 mg via INTRAVENOUS
  Administered 2014-10-02: 40 mg via INTRAVENOUS
  Administered 2014-10-02: 20 mg via INTRAVENOUS
  Administered 2014-10-02 (×5): 10 mg via INTRAVENOUS

## 2014-10-02 MED ORDER — KCL IN DEXTROSE-NACL 20-5-0.45 MEQ/L-%-% IV SOLN
80.0000 mL/h | INTRAVENOUS | Status: DC
  Administered 2014-10-02 – 2014-10-03 (×2): 80 mL/h via INTRAVENOUS
  Filled 2014-10-02 (×2): qty 1000

## 2014-10-02 MED ORDER — MORPHINE SULFATE 2 MG/ML IJ SOLN: 1.0000 mg | INTRAMUSCULAR | Status: DC | PRN

## 2014-10-02 MED ORDER — LACTATED RINGERS IV SOLN
INTRAVENOUS | Status: DC | PRN
  Administered 2014-10-02 (×4): via INTRAVENOUS

## 2014-10-02 MED ORDER — LIDOCAINE HCL (CARDIAC) 20 MG/ML IV SOLN
INTRAVENOUS | Status: DC | PRN
  Administered 2014-10-02: 70 mg via INTRAVENOUS

## 2014-10-02 MED ORDER — HYDROMORPHONE HCL 1 MG/ML IJ SOLN
INTRAMUSCULAR | Status: AC
  Administered 2014-10-02: 0.5 mg via INTRAVENOUS
  Filled 2014-10-02: qty 1

## 2014-10-02 SURGICAL SUPPLY — 75 items
APL SKNCLS STERI-STRIP NONHPOA (GAUZE/BANDAGES/DRESSINGS) ×2
BAG DECANTER FOR FLEXI CONT (MISCELLANEOUS) ×5 IMPLANT
BENZOIN TINCTURE PRP APPL 2/3 (GAUZE/BANDAGES/DRESSINGS) ×6 IMPLANT
BLADE CLIPPER SURG (BLADE) ×1 IMPLANT
BONE EQUIVA 10CC (Bone Implant) ×2 IMPLANT
BRUSH SCRUB EZ PLAIN DRY (MISCELLANEOUS) ×3 IMPLANT
BUR CUTTER 7.0 ROUND (BURR) ×3 IMPLANT
BUR MATCHSTICK NEURO 3.0 LAGG (BURR) ×3 IMPLANT
CANISTER SUCT 3000ML (MISCELLANEOUS) ×3 IMPLANT
CLOSURE WOUND 1/2 X4 (GAUZE/BANDAGES/DRESSINGS) ×2
CONT SPEC 4OZ CLIKSEAL STRL BL (MISCELLANEOUS) ×6 IMPLANT
COVER BACK TABLE 60X90IN (DRAPES) ×3 IMPLANT
DRAPE C-ARM 42X72 X-RAY (DRAPES) ×6 IMPLANT
DRAPE C-ARMOR (DRAPES) ×2 IMPLANT
DRAPE LAPAROTOMY 100X72X124 (DRAPES) ×3 IMPLANT
DRAPE SURG 17X23 STRL (DRAPES) ×6 IMPLANT
DRSG OPSITE 4X5.5 SM (GAUZE/BANDAGES/DRESSINGS) ×2 IMPLANT
DRSG OPSITE POSTOP 4X6 (GAUZE/BANDAGES/DRESSINGS) ×1 IMPLANT
DRSG OPSITE POSTOP 4X8 (GAUZE/BANDAGES/DRESSINGS) ×2 IMPLANT
DRSG TELFA 3X8 NADH (GAUZE/BANDAGES/DRESSINGS) IMPLANT
DURAPREP 26ML APPLICATOR (WOUND CARE) ×3 IMPLANT
ELECT REM PT RETURN 9FT ADLT (ELECTROSURGICAL) ×3
ELECTRODE REM PT RTRN 9FT ADLT (ELECTROSURGICAL) ×1 IMPLANT
EVACUATOR 1/8 PVC DRAIN (DRAIN) ×3 IMPLANT
GAUZE SPONGE 4X4 12PLY STRL (GAUZE/BANDAGES/DRESSINGS) ×3 IMPLANT
GAUZE SPONGE 4X4 16PLY XRAY LF (GAUZE/BANDAGES/DRESSINGS) IMPLANT
GLOVE BIO SURGEON STRL SZ7 (GLOVE) ×4 IMPLANT
GLOVE BIO SURGEON STRL SZ8 (GLOVE) ×2 IMPLANT
GLOVE BIOGEL PI IND STRL 7.0 (GLOVE) IMPLANT
GLOVE BIOGEL PI IND STRL 7.5 (GLOVE) IMPLANT
GLOVE BIOGEL PI IND STRL 8 (GLOVE) IMPLANT
GLOVE BIOGEL PI INDICATOR 7.0 (GLOVE) ×6
GLOVE BIOGEL PI INDICATOR 7.5 (GLOVE) ×2
GLOVE BIOGEL PI INDICATOR 8 (GLOVE) ×6
GLOVE ECLIPSE 6.5 STRL STRAW (GLOVE) ×8 IMPLANT
GLOVE ECLIPSE 7.5 STRL STRAW (GLOVE) ×4 IMPLANT
GLOVE ECLIPSE 8.0 STRL XLNG CF (GLOVE) ×8 IMPLANT
GLOVE SURG SS PI 8.0 STRL IVOR (GLOVE) ×6 IMPLANT
GOWN STRL REUS W/ TWL LRG LVL3 (GOWN DISPOSABLE) IMPLANT
GOWN STRL REUS W/ TWL XL LVL3 (GOWN DISPOSABLE) ×2 IMPLANT
GOWN STRL REUS W/TWL 2XL LVL3 (GOWN DISPOSABLE) IMPLANT
GOWN STRL REUS W/TWL LRG LVL3 (GOWN DISPOSABLE) ×12
GOWN STRL REUS W/TWL XL LVL3 (GOWN DISPOSABLE) ×15
IMPLANT ARDIS PEEK 109X22 (Orthopedic Implant) ×4 IMPLANT
IMPLANT PEEK ARDIS 10X22X30MM (Orthopedic Implant) ×2 IMPLANT
KIT BASIN OR (CUSTOM PROCEDURE TRAY) ×3 IMPLANT
KIT ROOM TURNOVER OR (KITS) ×3 IMPLANT
LIQUID BAND (GAUZE/BANDAGES/DRESSINGS) IMPLANT
NEEDLE HYPO 22GX1.5 SAFETY (NEEDLE) ×3 IMPLANT
NS IRRIG 1000ML POUR BTL (IV SOLUTION) ×3 IMPLANT
PACK LAMINECTOMY NEURO (CUSTOM PROCEDURE TRAY) ×3 IMPLANT
PAD ARMBOARD 7.5X6 YLW CONV (MISCELLANEOUS) ×13 IMPLANT
PAD DRESSING TELFA 3X8 NADH (GAUZE/BANDAGES/DRESSINGS) ×1 IMPLANT
PATTIES SURGICAL .75X.75 (GAUZE/BANDAGES/DRESSINGS) ×1 IMPLANT
PEDIGUARD CURV (INSTRUMENTS) ×2 IMPLANT
ROD PERC 55MM LUMBAR (Rod) ×4 IMPLANT
SCREW MIN INVASIVE 6.5X35 (Screw) ×4 IMPLANT
SCREW MIN INVASIVE 6.5X45 (Screw) ×4 IMPLANT
SCREW POLYAXIA MIS 6.5X40MM (Screw) ×4 IMPLANT
SPEEDLINK 2 MED (Screw) ×2 IMPLANT
SPONGE LAP 4X18 X RAY DECT (DISPOSABLE) ×2 IMPLANT
SPONGE SURGIFOAM ABS GEL 100 (HEMOSTASIS) ×5 IMPLANT
STRIP CLOSURE SKIN 1/2X4 (GAUZE/BANDAGES/DRESSINGS) ×4 IMPLANT
SUT PROLENE 0 CT 1 30 (SUTURE) ×2 IMPLANT
SUT VIC AB 0 CT1 18XCR BRD8 (SUTURE) ×1 IMPLANT
SUT VIC AB 0 CT1 8-18 (SUTURE) ×6
SUT VIC AB 2-0 OS6 18 (SUTURE) ×11 IMPLANT
SUT VIC AB 3-0 CP2 18 (SUTURE) ×5 IMPLANT
SYR 20ML ECCENTRIC (SYRINGE) ×3 IMPLANT
TOP CLSR SEQUOIA (Orthopedic Implant) ×12 IMPLANT
TOWEL OR 17X24 6PK STRL BLUE (TOWEL DISPOSABLE) ×3 IMPLANT
TOWEL OR 17X26 10 PK STRL BLUE (TOWEL DISPOSABLE) ×3 IMPLANT
TRAP SPECIMEN MUCOUS 40CC (MISCELLANEOUS) ×2 IMPLANT
TRAY FOLEY CATH 14FRSI W/METER (CATHETERS) ×3 IMPLANT
WATER STERILE IRR 1000ML POUR (IV SOLUTION) ×3 IMPLANT

## 2014-10-02 NOTE — Plan of Care (Signed)
Problem: Consults Goal: Diagnosis - Spinal Surgery Outcome: Completed/Met Date Met:  10/02/14 PLIF L4-5,L5-S1

## 2014-10-02 NOTE — Consult Note (Signed)
PHARMACY CONSULT NOTE--POST-PROCEDURE ANTIBIOTICS   Consult  :  Vancomycin  Indication :  Empiric Post-Op Spinal Surgery Prophylaxis   Pharmacy consulted for post-op dosing of Vancomycin s/p spinal surgery.  Patient has an epidural drain.    Wt 66 kg,  CrCl 57 ml/min.  PLAN:  1. Vancomycin 500 mg IV q 12 hours. 2. Goal 15 - 20 mcg/ml Monitor renal function, WBC, fever curve, any cultures/sensitivities, antibiotic levels as clinically indicated, and clinical progression.   Thank you for allowing Pharmacy to participate in this patient's care.   Mabell Esguerra, Craig Guess,  Pharm.D.,  10/02/2014,  4:24 PM

## 2014-10-02 NOTE — Anesthesia Postprocedure Evaluation (Signed)
  Anesthesia Post-op Note  Patient: Jeanette Gonzalez  Procedure(s) Performed: Procedure(s): POSTERIOR LUMBAR INTERBODY FUSION LUMBAR FOUR-FIVE,LUMBAR FIVE-SACRAL ONE WITH PATHFINDER SCREWS. (N/A)  Patient Location: PACU  Anesthesia Type:General  Level of Consciousness: awake and sedated  Airway and Oxygen Therapy: Patient Spontanous Breathing  Post-op Pain: mild  Post-op Assessment: Post-op Vital signs reviewed  Post-op Vital Signs: stable  Last Vitals:  Filed Vitals:   10/02/14 1505  BP:   Pulse: 72  Temp:   Resp: 16    Complications: No apparent anesthesia complications

## 2014-10-02 NOTE — H&P (Signed)
Jeanette Gonzalez is an 69 y.o. female.   Chief Complaint: Back pain down the right leg HPI: The patient is a 69 year old female who is evaluated in the office for back pain with radiation the right leg. She's had this problem for more than a year. She had back surgery in 2010 and did well until last year. There is no sudden event. She most of the problem though she did see an orthopedic surgeon in a few months ago. She prednisone without improvement. When seen again the had no suggestions and she came under surgical opinion. Once enough left ligament was asymptomatic. She underwent myelography with post Myelogram CAT scan which showed scoliosis and listhesis as well as degeneration stenosis at the levels of her previous surgery. The options were discussed the patient requested surgery now comes for a two-level interbody fusion for a pair for scoliosis and listhesis with instrumentation and interbody fusion. I had a long discussion with her regarding the risks and benefits of surgical intervention. The risks discussed include but are not limited to bleeding infection weakness some as paralysis spinal fluid leak trouble with instrumentation nonunion coma and death. We have discussed alternative methods of therapy offered risks and benefits of nonintervention. She's had the opportunity to ask numerous questions and appears to understand. With this information in hand she has requested we proceed with surgery.  Past Medical History  Diagnosis Date  . Osteopenia 04/2007  . Arthritis     spine  . Kidney stones   . GERD (gastroesophageal reflux disease)   . Malignant melanoma 12/2002    right elbow  . Basal cell carcinoma     x 2  . Breast cancer 07/1987    Past Surgical History  Procedure Laterality Date  . Breast surgery Right 1988    mastectomy, chemo  . Lasik    . Lumbar spine surgery  07/2009    repair lumbar spinal stenosis  . Eye surgery    . Tubal ligation  1979  . Colonoscopy w/  biopsies and polypectomy      x3 first one bx and polpys benign    Family History  Problem Relation Age of Onset  . Stroke Mother   . Cancer Father    Social History:  reports that she has never smoked. She has never used smokeless tobacco. She reports that she does not drink alcohol or use illicit drugs.  Allergies:  Allergies  Allergen Reactions  . Ancef [Cefazolin] Diarrhea    Developed C-dif when given with cipro  . Ciprofloxacin Diarrhea    Developed C-dif when given with ancef  . Prednisone Other (See Comments)    "felt strange" , increased heart rate    Medications Prior to Admission  Medication Sig Dispense Refill  . Calcium Carb-Cholecalciferol (CALCIUM 600 + D PO) Take 600 mg by mouth 3 (three) times daily.    . Lutein 40 MG CAPS Take 40 mg by mouth daily.    . Multiple Vitamin (MULTIVITAMIN WITH MINERALS) TABS tablet Take 1 tablet by mouth daily.    . Probiotic Product (PROBIOTIC DAILY PO) Take 1 capsule by mouth daily.    . Simethicone 180 MG CAPS Take 540 mg by mouth 2 (two) times daily.    . megestrol (MEGACE) 20 MG tablet Take 1 tablet (20 mg total) by mouth 2 (two) times daily. (Patient not taking: Reported on 09/24/2014) 60 tablet 12  . Vitamin D, Ergocalciferol, (DRISDOL) 50000 UNITS CAPS Take 1 capsule (50,000 Units total)  by mouth every 30 (thirty) days. (Patient taking differently: Take 50,000 Units by mouth every 30 (thirty) days. On the 1st of each month) 12 capsule 0    No results found for this or any previous visit (from the past 48 hour(s)). No results found.  Positive for the back and leg pain negative otherwise  There were no vitals taken for this visit.  The patient is awake alert and oriented. She is no facial asymmetry. She has a 1+ knee jerks trace ankle jerk reflexes. 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 The plan is for a two-level interbody fusion with pedicle screw  fixation.  Faythe Ghee, MD 10/02/2014, 7:33 AM

## 2014-10-02 NOTE — Anesthesia Procedure Notes (Signed)
Procedure Name: Intubation Date/Time: 10/02/2014 7:45 AM Performed by: Julian Reil Pre-anesthesia Checklist: Patient identified, Emergency Drugs available, Suction available and Patient being monitored Patient Re-evaluated:Patient Re-evaluated prior to inductionOxygen Delivery Method: Circle system utilized Preoxygenation: Pre-oxygenation with 100% oxygen Intubation Type: IV induction Ventilation: Mask ventilation without difficulty Laryngoscope Size: Mac and 3 Grade View: Grade II Tube type: Oral Tube size: 7.5 mm Number of attempts: 1 Airway Equipment and Method: Stylet Placement Confirmation: ETT inserted through vocal cords under direct vision,  positive ETCO2 and breath sounds checked- equal and bilateral Secured at: 21 cm Tube secured with: Tape Dental Injury: Teeth and Oropharynx as per pre-operative assessment

## 2014-10-02 NOTE — Op Note (Addendum)
Preop diagnosis: Spinal stenosis with scoliosis nerve root compression L4-5 L5-S1 with grade 1 spondylolisthesis L5-S1 Postop diagnosis: Same Procedure: Right L4-5 and bilateral L5-S1 decompressive laminectomies with decompression of L4-L5 and S1 nerve roots more so than needed for interbody fusion as well as relief of central spinal stenosis Bilateral L5-S1 microdiscectomy Right L4-5 microdiscectomy Right L4-5 transverse lumbar interbody fusion with peek interbody spacer L5-S1 posterior lumbar interbody fusion with peek interbody spacer L4-5 and L5-S1 posterolateral fusion Segmental pedicle screw instrumentation L4-5 L5-S1 with Pathfinder pedicle screw system Surgeon: Faron Whitelock Asst.: Jones  After being placed in the prone position the patient's back was prepped and draped in the usual sterile fashion. Previous lumbar incision was opened up and carried on the spinous processes of L3 and L4 and then into the soft tissues bilaterally. Subperiosteal dissection was then carried out on the spinous processes and lamina facet joint of L3 and L4. We then identified the residual facet joint at L5 and S1 bilaterally with her dinner previous midline laminectomy. Self-retaining retractor was placed for exposure and excision approach the appropriate levels. On the right side at L4-5 generous laminotomy was performed by removing the inferior 80% of the L4 lamina the medial three quarters of the facet joint and the superior one third of the L5 lamina. Residual bone and ligament flavum removed in a piecemeal fashion. We then thoroughly cleaned the disc at L4-5 on the right with anticipation interbody fusion later in the case. We then did residual bilateral laminotomies at L5-S1 to the point where we had virtually removed the entire facet joint laterally. Any residual midline bone was removed as well along with scar tissue decompressed the central canal lateral recess bilaterally. L4-L5 and S1 nerve roots well visualized  well decompressed more so than needed for interbody fusion. We then entered the disc at L5-S1 thoroughly cleaned out. We used distractors to open up to a 10 mm size to help with the disc space cleanout. We then prepared the disc for interbody fusion with aggressive decortication of the endplates and we then placed a 10 x 9 x 2 2 mm cage bilaterally it was filled with a mixture of autologous bone and morselized allograft. Prior to placing the second cage a similar mixture was placed deep within the interspace to help with interbody fusion. We then did a transverse fusion at L4-5 on the right. Chose a 10 x 11 x 30 mm cage and filled with a mixture of autologous bone and morselized allograft. We filled with the same mixture of autologous bone morselized allograft which we also packed deep within the midline to help with the interbody fusion. We then impacted the cage into good position kicked it into a good transverse position. Final fluoroscopy looked good. We then placed pedicle screws in an open fashion. We used drill hole entry points passed the ultrasonic guided pedicle all tapped with a 6 mm tap and placed 6.5 x 45 mm screws bilaterally at L4 6.5 x 40 mm screws bilaterally at L5 and 6.5 x 35 mm screws at S1. These were followed in good position under AP lateral fluoroscopy. We then chose appropriately length rods and secured them to the top of the screws and did tightening and final tightening with torque and counter torque. On the left side we compressed to help with reduction of the scoliosis. We then irrigated the wound copiously controlled any bleeding with upper coagulation Gelfoam. We did place a cross-link. We left an epidural drain in the epidural space  and brought out through a separate stab wound incision. The was then closed in multiple layers of Vicryl on the muscle fascia subcutaneous and subcuticular tissues. Running locking Prolene was placed on the skin. A sterile dressing was then applied the  patient was extubated and taken to recovery room in stable condition.  After the pedicle screws were placed, we decorticated the far lateral region and placed a mixture of autologous bone and morselized allograft at L45 and L5S1 for a posterolateral fusion.

## 2014-10-02 NOTE — Progress Notes (Signed)
Utilization review completed.  

## 2014-10-02 NOTE — Transfer of Care (Signed)
Immediate Anesthesia Transfer of Care Note  Patient: Jeanette Gonzalez  Procedure(s) Performed: Procedure(s): POSTERIOR LUMBAR INTERBODY FUSION LUMBAR FOUR-FIVE,LUMBAR FIVE-SACRAL ONE WITH PATHFINDER SCREWS. (N/A)  Patient Location: PACU  Anesthesia Type:General  Level of Consciousness: awake, patient cooperative and responds to stimulation  Airway & Oxygen Therapy: Patient Spontanous Breathing and Patient connected to nasal cannula oxygen  Post-op Assessment: Report given to PACU RN and Post -op Vital signs reviewed and stable  Post vital signs: Reviewed and stable  Last Vitals:  Filed Vitals:   10/02/14 1342  Temp: 37.0 C    Complications: No apparent anesthesia complications

## 2014-10-02 NOTE — Anesthesia Preprocedure Evaluation (Signed)
Anesthesia Evaluation  Patient identified by MRN, date of birth, ID band Patient awake    Reviewed: Allergy & Precautions, NPO status   History of Anesthesia Complications Negative for: history of anesthetic complications  Airway Mallampati: I       Dental  (+) Teeth Intact   Pulmonary neg pulmonary ROS,  breath sounds clear to auscultation        Cardiovascular negative cardio ROS  Rhythm:Regular Rate:Normal     Neuro/Psych negative neurological ROS     GI/Hepatic GERD-  ,  Endo/Other    Renal/GU      Musculoskeletal  (+) Arthritis -,   Abdominal   Peds  Hematology   Anesthesia Other Findings   Reproductive/Obstetrics                             Anesthesia Physical Anesthesia Plan  ASA: I  Anesthesia Plan: General   Post-op Pain Management:    Induction: Intravenous  Airway Management Planned: Oral ETT  Additional Equipment:   Intra-op Plan:   Post-operative Plan: Extubation in OR  Informed Consent: I have reviewed the patients History and Physical, chart, labs and discussed the procedure including the risks, benefits and alternatives for the proposed anesthesia with the patient or authorized representative who has indicated his/her understanding and acceptance.   Dental advisory given  Plan Discussed with: CRNA and Surgeon  Anesthesia Plan Comments:         Anesthesia Quick Evaluation

## 2014-10-03 MED ORDER — PANTOPRAZOLE SODIUM 40 MG PO TBEC
40.0000 mg | DELAYED_RELEASE_TABLET | Freq: Every day | ORAL | Status: DC
  Administered 2014-10-03 – 2014-10-07 (×5): 40 mg via ORAL
  Filled 2014-10-03 (×5): qty 1

## 2014-10-03 MED ORDER — SENNA 8.6 MG PO TABS
1.0000 | ORAL_TABLET | Freq: Every day | ORAL | Status: DC
  Administered 2014-10-03 – 2014-10-06 (×4): 8.6 mg via ORAL
  Filled 2014-10-03 (×5): qty 1

## 2014-10-03 NOTE — Progress Notes (Addendum)
  Pharmacy: Re-vancomycin  Patient is a 69 y.o F s/p spinal surgery with vancomycin started post-op for prophylaxis. Per RN, epidural drain was removed today.  Will d/c vancomycin since drain is out.  She's also tolerating oral medications-- will change protonix to PO per P&T policy.  Dia Sitter, PharmD, BCPS

## 2014-10-03 NOTE — Care Management Note (Signed)
    Page 1 of 1   10/08/2014     10:25:05 AM CARE MANAGEMENT NOTE 10/08/2014  Patient:  Jeanette Gonzalez, Jeanette Gonzalez   Account Number:  [id number]  Date Initiated:  10/03/2014  Documentation initiated by:  Lorne Skeens  Subjective/Objective Assessment:   Patient was admitted for a PLIF.  Lives at home with spouse.     Action/Plan:   Will follow for discharge needs pending physician orders   Anticipated DC Date:     Anticipated DC Plan:  HOME/SELF CARE         Choice offered to / List presented to:  C-1 Patient   DME arranged  3-N-1  Vassie Moselle      DME agency  [place]        Status of service:  Completed, signed off Medicare Important Message given?  YES (If response is "NO", the following Medicare IM given date fields will be blank) Date Medicare IM given:  10/06/2014 Medicare IM given by:  Lorne Skeens Date Additional Medicare IM given:   Additional Medicare IM given by:    Discharge Disposition:  HOME/SELF CARE  Per UR Regulation:  Reviewed for med. necessity/level of care/duration of stay  If discussed at [place] of Stay Meetings, dates discussed:    Comments:  10/08/14 [place] RN, MSN, CM- Met with patient to discuss DME. Patient prefers that DME be delivered to her home so discharge will not be delayed.  Jeneen Rinks with [employer] was notified of DME orders and information was faxed. Verification of receipt obtained.  Address and phone number verified prior to faxing.   10/06/14 Rosa Sanchez, MSN, CM- Medicare IM letter provided.

## 2014-10-03 NOTE — Progress Notes (Signed)
Patient ID: Jeanette Gonzalez, female   DOB: 09-Dec-1945, 69 y.o.   MRN: [id number] Afeb, vss No new neuro issues Felt weak in the legs, but has no weakness to confrontational testing, and I personally stood her bedside and she supported herself without difficulty. Her wound looks good. Will start to ambulate today, and if she does well she can possibly be ready for d/c tomorrow.

## 2014-10-03 NOTE — Progress Notes (Signed)
Pt ambulated in the hallway appx 150 ft with RN, using brace and walker.  Pt demonstrated knee instability once again. Pt voided and back to bed.

## 2014-10-03 NOTE — Progress Notes (Signed)
Pt ambulated to the bathroom with RN, using brace and walker.  Pt encouraged to walk without walker but she reports that her knees have been unstable with ambulation, which they were at this time.  Will attempt further distance later today.

## 2014-10-04 LAB — GLUCOSE, CAPILLARY: Glucose-Capillary: 114 mg/dL — ABNORMAL HIGH (ref 70–99)

## 2014-10-04 NOTE — Progress Notes (Signed)
Patient ID: Jeanette Gonzalez, female   DOB: 1946-09-01, 69 y.o.   MRN: [id number] BP 92/47 mmHg  Pulse 72  Temp(Src) 98.6 F (37 C) (Oral)  Resp 18  Ht 5\' 6"  (1.676 m)  Wt 65.5 kg (144 lb 6.4 oz)  BMI 23.32 kg/m2  SpO2 98% Alert and oriented x 4 Speech is clear and fluent Moving lower extremities well, not able to walk to her satisfaction Do believe this is related to post op pain No neurological issues  Needs more time and physical therapy

## 2014-10-04 NOTE — Progress Notes (Signed)
Pt ambulated to the bathroom with RN using brace and walker.  Pt then attempted to ambulate into the hallway but stopped due to severe pain and dizziness.  Pt stated she felt as if she was going to "pass out".  Vitals and blood sugar obtained and all within normal limits.

## 2014-10-05 LAB — GLUCOSE, CAPILLARY: Glucose-Capillary: 124 mg/dL — ABNORMAL HIGH (ref 70–99)

## 2014-10-05 NOTE — Progress Notes (Signed)
No issues overnight. Pt says her leg weakness is improving, able to walk in hallway yesterday. Pain in getting better.   EXAM:  BP 97/42 mmHg  Pulse 79  Temp(Src) 98.7 F (37.1 C) (Oral)  Resp 16  Ht 5\' 6"  (1.676 m)  Wt 65.5 kg (144 lb 6.4 oz)  BMI 23.32 kg/m2  SpO2 100%  Awake, alert, oriented  Speech fluent, appropriate  CN grossly intact  5/5 BUE/BLE   IMPRESSION:  69 y.o. female POD# 3 s/p PLIF, recovering slowly  PLAN: - Cont to mobilize today. She doesn't feel ready to go home. Will reassess tomorrow.

## 2014-10-05 NOTE — Progress Notes (Signed)
Pt unable to eat lunch or dinner. She states that she is very weak. She also states that she feels the same way she felt when she had c diff infection with her last back surgery. However, pt has not had any loose stools this admission. On-call doctor paged regarding pt's symptoms. Waiting on call back from MD. Will continue to monitor pt.

## 2014-10-06 MED ORDER — LACTATED RINGERS IV BOLUS (SEPSIS)
500.0000 mL | Freq: Once | INTRAVENOUS | Status: AC
  Administered 2014-10-06: 500 mL via INTRAVENOUS

## 2014-10-06 MED ORDER — BISACODYL 10 MG RE SUPP
10.0000 mg | Freq: Every day | RECTAL | Status: DC | PRN
  Administered 2014-10-06 – 2014-10-07 (×2): 10 mg via RECTAL
  Filled 2014-10-06 (×2): qty 1

## 2014-10-06 MED ORDER — POLYETHYLENE GLYCOL 3350 17 G PO PACK: 17.0000 g | PACK | Freq: Every day | ORAL | Status: DC

## 2014-10-06 MED ORDER — POLYETHYLENE GLYCOL 3350 17 G PO PACK
17.0000 g | PACK | Freq: Every day | ORAL | Status: DC | PRN
  Administered 2014-10-06: 17 g via ORAL
  Filled 2014-10-06: qty 1

## 2014-10-06 NOTE — Evaluation (Signed)
Physical Therapy Evaluation Patient Details Name: Jeanette Gonzalez MRN: [id number] DOB: October 21, 1945 Today's Date: 10/06/2014   History of Present Illness  69 y.o. s/p PLIF L4-5,L5-S1.  Clinical Impression  Patient presents with functional limitations due to deficits listed in PT problem list (see below). Pt with generalized weakness and balance deficits requiring use of RW for support. Pt with increased difficulty with transfers and transitioning sit to/from stand secondary to weakness. Pt s/p bolus and no symptoms of hypotension. Will need to perform stair training prior to discharge. Pt would benefit from skilled PT to improve transfers, gait, balance and mobility so pt can maximize independence and ease burden of care prior to return home.    Follow Up Recommendations Home health PT;Supervision/Assistance - 24 hour    Equipment Recommendations  Rolling walker with 5" wheels    Recommendations for Other Services       Precautions / Restrictions Precautions Precautions: Back;Fall Precaution Booklet Issued: No Precaution Comments: Able to verbalize 2/3 back precautions. Required Braces or Orthoses: Spinal Brace Spinal Brace: Lumbar corset;Applied in sitting position Restrictions Weight Bearing Restrictions: No      Mobility  Bed Mobility Overal bed mobility: Needs Assistance Bed Mobility: Rolling;Sidelying to Sit Rolling: Modified independent (Device/Increase time) Sidelying to sit: Modified independent (Device/Increase time)     Sit to sidelying: Supervision General bed mobility comments: HOB 30 degrees, no use of rails to simulate home environment.  Transfers Overall transfer level: Needs assistance Equipment used: Rolling walker (2 wheeled) Transfers: Sit to/from Stand Sit to Stand: Mod assist;From elevated surface Stand pivot transfers: Min assist;Mod assist;+2 physical assistance;+2 safety/equipment       General transfer comment: Mod A to rise from elevated  bed height. Cues for hand placement. Increased difficulty and effort to extend hips and knees.  Ambulation/Gait Ambulation/Gait assistance: Min guard Ambulation Distance (Feet): 500 Feet Assistive device: Rolling walker (2 wheeled) Gait Pattern/deviations: Step-through pattern;Decreased stride length     General Gait Details: Pt with slow, steady gait. No knee buckling.   Stairs            Wheelchair Mobility    Modified Rankin (Stroke Patients Only)       Balance Overall balance assessment: Needs assistance Sitting-balance support: Feet supported;No upper extremity supported Sitting balance-Leahy Scale: Good Sitting balance - Comments: Able to donn brace sitting EOB with setup.   Standing balance support: During functional activity Standing balance-Leahy Scale: Poor                               Pertinent Vitals/Pain Pain Assessment: No/denies pain Pain Score: 2  Pain Location: back Pain Intervention(s): Monitored during session;Repositioned    Home Living Family/patient expects to be discharged to:: Private residence Living Arrangements: Spouse/significant other Available Help at Discharge: Family;Available 24 hours/day Type of Home: House Home Access: Stairs to enter Entrance Stairs-Rails: Right Entrance Stairs-Number of Steps: 3 Home Layout: One level Home Equipment: None      Prior Function Level of Independence: Independent               Hand Dominance   Dominant Hand: Right    Extremity/Trunk Assessment   Upper Extremity Assessment: Defer to OT evaluation           Lower Extremity Assessment: Generalized weakness (Sensation WFL BLEs.)         Communication   Communication: No difficulties  Cognition Arousal/Alertness: Awake/alert Behavior During Therapy: WFL for  tasks assessed/performed Overall Cognitive Status: Within Functional Limits for tasks assessed       Memory: Decreased recall of precautions               General Comments      Exercises        Assessment/Plan    PT Assessment Patient needs continued PT services  PT Diagnosis Generalized weakness   PT Problem List Decreased strength;Decreased mobility;Decreased knowledge of precautions;Decreased balance  PT Treatment Interventions Balance training;Gait training;Stair training;Patient/family education;Therapeutic exercise;Therapeutic activities   PT Goals (Current goals can be found in the Care Plan section) Acute Rehab PT Goals Patient Stated Goal: to be independent PT Goal Formulation: With patient Time For Goal Achievement: 10/20/14 Potential to Achieve Goals: Fair    Frequency Min 5X/week   Barriers to discharge        Co-evaluation               End of Session Equipment Utilized During Treatment: Gait belt;Back brace Activity Tolerance: Patient tolerated treatment well Patient left: in chair;with call bell/phone within reach;with family/visitor present Nurse Communication: Mobility status (Educated pt to change positions every 30-45 mins)         Time: 3532-9924 PT Time Calculation (min) (ACUTE ONLY): 28 min   Charges:   PT Evaluation $Initial PT Evaluation Tier I: 1 Procedure PT Treatments $Self Care/Home Management: 8-22   PT G CodesCandy Sledge A 11-04-2014, 3:52 PM Candy Sledge, [place], DPT [phone]

## 2014-10-06 NOTE — Progress Notes (Signed)
Pt ambulated in the hallway appx 200 ft with RN, using brace and walker.  Pt tolerated ambulation well, steady gait.

## 2014-10-06 NOTE — Evaluation (Addendum)
Occupational Therapy Evaluation Patient Details Name: Jeanette Gonzalez MRN: [id number] DOB: [birth year] Today's Date: 10/06/2014    History of Present Illness 69 y.o. s/p PLIF L4-5,L5-S1.   Clinical Impression   Pt s/p above. Pt independent with ADLs, PTA. Feel pt will benefit from acute OT to increase independence with BADLs. Pt with near syncopal episode in session (BP 88/48) reporting that everything was going dark. Spouse available to assist pt at home but can't do a lot of lifting. Recommending CIR consult.     Follow Up Recommendations  CIR    Equipment Recommendations  3 in 1 bedside comode;Other (comment) (AE)    Recommendations for Other Services       Precautions / Restrictions Precautions Precautions: Back;Fall Precaution Booklet Issued: No Precaution Comments: educated on back precautions Required Braces or Orthoses: Spinal Brace Spinal Brace: Lumbar corset;Applied in sitting position Restrictions Weight Bearing Restrictions: No      Mobility Bed Mobility Overal bed mobility: Needs Assistance Bed Mobility: Rolling;Sidelying to Sit;Sit to Sidelying Rolling: Modified independent (Device/Increase time) Sidelying to sit: Modified independent (Device/Increase time)     Sit to sidelying: Supervision General bed mobility comments: supervision to get back in bed due to near syncopal episode.  Transfers Overall transfer level: Needs assistance Equipment used: Rolling walker (2 wheeled) Transfers: Sit to/from [employer] Sit to Stand: Mod assist;Min assist;+2 physical assistance Stand pivot transfers: Min assist;Mod assist;+2 physical assistance;+2 safety/equipment-due to near syncopal episode       General transfer comment: Mod A for sit to stand from bed. Educated on technique. +2 assist to get to chair and then to bed due to near syncopal episode.          ADL Overall ADL's : Needs assistance/impaired Eating/Feeding:  Sitting;Independent   Grooming: Set up;Supervision/safety;Sitting; washed face   Upper Body Bathing: Set up;Supervision/ safety;Sitting   Lower Body Bathing: Maximal assistance;Sit to/from stand   Upper Body Dressing : Set up;Supervision/safety;Sitting   Lower Body Dressing: Sit to/from stand;Minimal assistance;With adaptive equipment   Toilet Transfer: Min guard;Ambulation;+2 for safety/equipment;Stand-pivot;Moderate assistance;Minimal assistance;+2 for physical assistance (+2 assist later in session as pt had near sycopal episode)   Toileting- Clothing Manipulation and Hygiene: Sit to/from stand;Total assist;Supervision/safety; Set up (physical assist due to near syncopal episode; earlier able to perform clothing management standing at supervision level and wiped peri area sitting.)       Functional mobility during ADLs: Min guard;+2 for safety/equipment;Moderate assistance;Minimal assistance;Rolling walker (performed stand pivot back to bed and to chair ) General ADL Comments: Educated on AE/cost/where she can purchase. Educated on safety such as safe shoewear, use of bag on walker, safe shoewear, and rugs/items on floor. Educated on LB dressing technique-pt unable to cross legs over knees, so practiced with AE. Educated on use of cup for oral care and placement of grooming items to avoid breaking precautions.  Discussed d/c plans and briefly talked about CIR. Educated on back brace. Discussed incorporating precautions into functional activities.  Educated on 3 in 1/options for shower chair.       Vision  Pt wears reading glasses.                   Perception     Praxis      Pertinent Vitals/Pain Pain Assessment: 0-10 Pain Score: 2  Pain Location: back Pain Intervention(s): Monitored during session;Repositioned   BP 88/48     Hand Dominance Right   Extremity/Trunk Assessment Upper Extremity Assessment Upper Extremity Assessment: Overall [facility]  for tasks assessed    Lower Extremity Assessment Lower Extremity Assessment: Defer to PT evaluation       Communication Communication Communication: No difficulties   Cognition Arousal/Alertness: Awake/alert Behavior During Therapy: Flat affect Overall Cognitive Status: Within Functional Limits for tasks assessed                     General Comments       Exercises       Shoulder Instructions      Home Living Family/patient expects to be discharged to:: Private residence Living Arrangements: Spouse/significant other Available Help at Discharge: Family;Available 24 hours/day (spouse has back issues and can't do a lot of lifting) Type of Home: House Home Access: Stairs to enter [employer] of Steps: 3 Entrance Stairs-Rails: Right Home Layout: One level     Bathroom Shower/Tub: Tub/shower unit;Walk-in shower   Bathroom Toilet: Standard                Prior Functioning/Environment Level of Independence: Independent             OT Diagnosis: Generalized weakness;Acute pain   OT Problem List: Decreased strength;Decreased range of motion;Decreased activity tolerance;Decreased knowledge of use of DME or AE;Decreased knowledge of precautions;Pain   OT Treatment/Interventions: Self-care/ADL training;DME and/or AE instruction;Therapeutic activities;Patient/family education;Balance training    OT Goals(Current goals can be found in the care plan section) Acute Rehab OT Goals Patient Stated Goal: be independent OT Goal Formulation: With patient Time For Goal Achievement: 10/13/14 Potential to Achieve Goals: Good ADL Goals Pt Will Perform Grooming: with modified independence;standing Pt Will Perform Lower Body Dressing: with modified independence;sit to/from stand;with adaptive equipment Pt Will Transfer to Toilet: ambulating;with modified independence Pt Will Perform Toileting - Clothing Manipulation and hygiene: with modified independence;sit to/from stand  OT  Frequency: Min 2X/week   Barriers to D/C:            Co-evaluation              End of Session Equipment Utilized During Treatment: Gait belt;Rolling walker;Back brace Nurse Communication: Mobility status;Other (comment) (came in to assist OT to get pt to chair and then to bed.)  Activity Tolerance: Other (comment) (near syncopal episode) Patient left: in bed;with call bell/phone within reach;with bed alarm set   Time: 1212-1243 OT Time Calculation (min): 31 min Charges:  OT General Charges $OT Visit: 1 Procedure OT Evaluation $Initial OT Evaluation Tier I: 1 Procedure OT Treatments $Self Care/Home Management : 8-22 mins G-CodesBenito Mccreedy OTR/L [phone] 10/06/2014, 1:34 PM

## 2014-10-06 NOTE — Progress Notes (Addendum)
Pt became lightheaded and reports that "everything is going dark" during OT session.  VS taken, BP 88/48, all other VS WNL. Pt back to bed, reports some relief.  MD notified. 500 cc LR bolus given.  Will retake BP after administration complete.

## 2014-10-06 NOTE — Progress Notes (Signed)
Patient ID: Jeanette Gonzalez, female   DOB: 06-May-1946, 69 y.o.   MRN: [id number] Afeb, vss She says she feels weak, but we got her up with minimal assistance, and she then ambulated in the hall with her walker with no difficulty. She says that she feels very unsteady.  Her sensation to light touch is intact. Her individual motor testing seems intact. Will consult PT/OT and see if they think she would benefit from CIR.  With her slow progress, I think that might be very helpful in her recovery.

## 2014-10-06 NOTE — Progress Notes (Signed)
Thank you for consult on Jeanette Gonzalez. Patient underwent PLIF L4-S1 on 10/02/14 and she has been ambulating with nursing per records. Will await formal PT/OT evaluations to help determine appropriate rehab needs.

## 2014-10-06 NOTE — Progress Notes (Signed)
Chart reviewed. PT evaluation still pending and patient limited due to syncopal episode. Will follow up in am to complete consult.

## 2014-10-07 NOTE — Progress Notes (Addendum)
Occupational Therapy Treatment Patient Details Name: Jeanette Gonzalez MRN: [id number] DOB: Feb 19, 1946 Today's Date: 10/07/2014    History of present illness 69 y.o. s/p PLIF L4-5,L5-S1.   OT comments  Pt progressing and moving well. Education provided in session. Plan to practice tub or shower transfer next session.  Follow Up Recommendations  No OT follow up;Supervision - Intermittent (OOB/Mobility)    Equipment Recommendations  3 in 1 bedside comode;Other (comment) (AE)    Recommendations for Other Services      Precautions / Restrictions Precautions Precautions: Back;Fall Precaution Booklet Issued: No Precaution Comments: Able to verbalize 3/3 back precautions. Required Braces or Orthoses: Spinal Brace Spinal Brace: Lumbar corset;Applied in sitting position Restrictions Weight Bearing Restrictions: No       Mobility Bed Mobility Overal bed mobility: Needs Assistance Bed Mobility: Sidelying to Sit Sidelying to sit: Supervision       General bed mobility comments: cues for technique/precaution.  Transfers Overall transfer level: Needs assistance Equipment used: Rolling walker (2 wheeled) Transfers: Sit to/from Stand Sit to Stand: Min guard;Min assist (assist to hold walker to be sure it was steady as she pulled up on it from bed)         General transfer comment: Pt pulling up on walker to stand from bed. Able to push up from armrests on 3 in 1 and chair for sit to stand transfer. Cues for technique.        ADL Overall ADL's : Needs assistance/impaired     Grooming: Wash/dry hands;Wash/dry face;Set up;Supervision/safety;Standing           Upper Body Dressing : Supervision/safety;Set up;Sitting Upper Body Dressing Details (indicate cue type and reason): back brace Lower Body Dressing: Min guard;Sit to/from stand;With adaptive equipment   Toilet Transfer: Ambulation;Supervision/safety;RW (3 in 1 over commode)   Toileting- Clothing Manipulation  and Hygiene: Sit to/from stand;Min guard       Functional mobility during ADLs: Supervision/safety;Rolling walker General ADL Comments: Reviewed AE/cost/where to purchase and pt practiced donning/doffing sock and donning panties with AE. Reviewed use of cup for oral care and placement of grooming items to avoid breaking precautions. Reviewed back brace information. Reviewed/educated on safety. Educated on shower and tub transfer techniques and car transfer technique. Discussed d/c recommendations of not recommending HHOT at this time.  Educated on 3 in 1. Educated on positioning of pillows.       Vision                     Perception     Praxis      Cognition  Awake/ALert Behavior During Therapy: WFL for tasks assessed/performed Overall Cognitive Status: Within Functional Limits for tasks assessed                       Extremity/Trunk Assessment               Exercises     Shoulder Instructions       General Comments      Pertinent Vitals/ Pain       Pain Assessment: 0-10 Pain Score: 3  Pain Location: back (with transfers) Pain Intervention(s): Monitored during session  Home Living                                          Prior Functioning/Environment  Frequency Min 2X/week     Progress Toward Goals  OT Goals(current goals can now be found in the care plan section)  Progress towards OT goals: Progressing toward goals  Acute Rehab OT Goals Patient Stated Goal: not stated OT Goal Formulation: With patient Time For Goal Achievement: 10/13/14 Potential to Achieve Goals: Good ADL Goals Pt Will Perform Grooming: with modified independence;standing Pt Will Perform Lower Body Dressing: with modified independence;sit to/from stand;with adaptive equipment Pt Will Transfer to Toilet: ambulating;with modified independence Pt Will Perform Toileting - Clothing Manipulation and hygiene: with modified  independence;sit to/from stand Pt Will Perform Tub/Shower Transfer: Tub transfer;Shower transfer;with supervision;ambulating;3 in 1;rolling walker  Plan Discharge plan needs to be updated    Co-evaluation                 End of Session Equipment Utilized During Treatment: Gait belt;Rolling walker;Back brace   Activity Tolerance Patient tolerated treatment well   Patient Left in chair;with call bell/phone within reach   Nurse Communication          Time: 7628-3151 OT Time Calculation (min): 18 min  Charges: OT General Charges $OT Visit: 1 Procedure OT Treatments $Self Care/Home Management : 8-22 mins  Benito Mccreedy OTR/L [phone] 10/07/2014, 1:37 PM

## 2014-10-07 NOTE — Progress Notes (Signed)
Physical Therapy Treatment Patient Details Name: Jeanette Gonzalez MRN: [id number] DOB: [birth year] Today's Date: 10/07/2014    History of Present Illness 69 y.o. s/p PLIF L4-5,L5-S1.    PT Comments    Patient progressing well with mobility. Able to stand without physical assist today however still with some difficulty and increased effort. Tolerated stair negotiation with Mod A due to knee buckling during ascent on first step. Performed family training on steps. Pt able to recall back precautions. Would benefit from 1 additional session for stair training prior to discharge home.   Follow Up Recommendations  Home health PT;Supervision/Assistance - 24 hour     Equipment Recommendations  Rolling walker with 5" wheels;3in1 (PT)    Recommendations for Other Services       Precautions / Restrictions Precautions Precautions: Back;Fall Precaution Comments: Able to verbalize 3/3 back precautions. Required Braces or Orthoses: Spinal Brace Spinal Brace: Lumbar corset;Applied in sitting position Restrictions Weight Bearing Restrictions: No    Mobility  Bed Mobility Overal bed mobility: Needs Assistance Bed Mobility: Rolling;Sidelying to Sit Rolling: Modified independent (Device/Increase time) Sidelying to sit: Modified independent (Device/Increase time)       General bed mobility comments: HOB flat no use of rails to simulate home environment.  Transfers Overall transfer level: Needs assistance Equipment used: Rolling walker (2 wheeled) Transfers: Sit to/from Stand Sit to Stand: Min guard         General transfer comment: Min guard to stand from low EOB. Cues for hand placement however only way pt able to stand without assist is to push up on RW relying on UEs  Ambulation/Gait Ambulation/Gait assistance: Supervision Ambulation Distance (Feet): 500 Feet Assistive device: Rolling walker (2 wheeled) Gait Pattern/deviations: Step-through pattern;Decreased stride length      General Gait Details: Pt with slow, steady gait. No knee buckling.    Stairs Stairs: Yes Stairs assistance: Mod assist Stair Management: One rail Right;Step to pattern;Forwards Number of Stairs: 3 (x3 bouts) General stair comments: Pt with right knee buckling during ascent with mod A to prevent fall. Adjusted to have pt use rail in RUE and hand held assist on left for support. Performed family training 2 more times for pt and spouse comfort requiring min A for support.  Wheelchair Mobility    Modified Rankin (Stroke Patients Only)       Balance Overall balance assessment: Needs assistance Sitting-balance support: Feet supported;No upper extremity supported Sitting balance-Leahy Scale: Good     Standing balance support: During functional activity Standing balance-Leahy Scale: Fair                      Cognition Arousal/Alertness: Awake/alert Behavior During Therapy: WFL for tasks assessed/performed Overall Cognitive Status: Within Functional Limits for tasks assessed                      Exercises      General Comments General comments (skin integrity, edema, etc.): Education provided on safety techniques for home and family training for stair negotiation. Spouse and pt able to demonstrate safe stair negotiation with appropriate technique and hand placement.       Pertinent Vitals/Pain Pain Assessment: No/denies pain    Home Living                      Prior Function            PT Goals (current goals can now be found in the care plan section)  Progress towards PT goals: Progressing toward goals    Frequency  Min 5X/week    PT Plan Current plan remains appropriate    Co-evaluation             End of Session Equipment Utilized During Treatment: Gait belt;Back brace Activity Tolerance: Patient tolerated treatment well Patient left: with family/visitor present;Other (comment) (walking the halls with spouse upon PT  departure.)     Time: 0949-1015 PT Time Calculation (min) (ACUTE ONLY): 26 min  Charges:  $Gait Training: 23-37 mins                    G CodesCandy Sledge A 10/21/2014, 11:44 AM Candy Sledge, [place], DPT [phone]

## 2014-10-07 NOTE — Progress Notes (Signed)
UR complete.  Yurianna Tusing RN, MSN 

## 2014-10-07 NOTE — Progress Notes (Signed)
PT notes reviewed and note that [facility] therapy recommended for follow up.  Would recommend SNF if husband is unable to provide supervision past discharge.

## 2014-10-07 NOTE — Progress Notes (Signed)
Patient ID: Jeanette Gonzalez, female   DOB: 18-Mar-1946, 69 y.o.   MRN: [id number] Afeb, vss Feels much better today than she has.  Increasing activity steadily. Will get DME ready and plan d/c tomorrow am.

## 2014-10-08 MED ORDER — OXYCODONE-ACETAMINOPHEN 5-325 MG PO TABS: 1.0000 | ORAL_TABLET | ORAL | Status: DC | PRN

## 2014-10-08 NOTE — Discharge Summary (Signed)
  Physician Discharge Summary  Patient ID: ANUJA MANKA MRN: [id number] DOB/AGE: [birth year] 69 y.o.  Admit date: 10/02/2014 Discharge date: 10/08/2014  Admission Diagnoses:  Discharge Diagnoses:  Active Problems:   Scoliosis of lumbar spine   Discharged Condition: good  Hospital Course: Surgery 6 days ago for 2 level back fusion. Very slow to increase activity, but eventually made progress, and did well at that time. By pod 6, ambulated well. Pain much better. Wound healing well. Home with specific instructions given.  Consults: rehabilitation medicine  Significant Diagnostic Studies: none  Treatments: surgery: L 45 L5 S1 fusion  Discharge Exam: Blood pressure 114/59, pulse 90, temperature 98.3 F (36.8 C), temperature source Oral, resp. rate 16, height 5\' 6"  (1.676 m), weight 65.5 kg (144 lb 6.4 oz), SpO2 100 %. Incision/Wound:clean and dry; no new neuro issues  Disposition:      Medication List    ASK your doctor about these medications        CALCIUM 600 + D PO  Take 600 mg by mouth 3 (three) times daily.     Lutein 40 MG Caps  Take 40 mg by mouth daily.     megestrol 20 MG tablet  Commonly known as:  MEGACE  Take 1 tablet (20 mg total) by mouth 2 (two) times daily.     multivitamin with minerals Tabs tablet  Take 1 tablet by mouth daily.     PROBIOTIC DAILY PO  Take 1 capsule by mouth daily.     Simethicone 180 MG Caps  Take 540 mg by mouth 2 (two) times daily.     Vitamin D (Ergocalciferol) 50000 UNITS Caps capsule  Commonly known as:  DRISDOL  Take 1 capsule (50,000 Units total) by mouth every 30 (thirty) days.         At home rest most of the time. Get up 9 or 10 times each day and take a 15 or 20 minute walk. No riding in the car and to your first postoperative appointment. If you have neck surgery you may shower from the chest down starting on the third postoperative day. If you had back surgery he may start showering on the third  postoperative day with saran wrap wrapped around your incisional area 3 times. After the shower remove the saran wrap. Take pain medicine as needed and other medications as instructed. Call my office for an appointment.  SignedFaythe Ghee, MD 10/08/2014, 8:43 AM

## 2014-10-08 NOTE — Progress Notes (Signed)
Patient discharged home with husband. Discharge instructions and medications reviewed with patient and spouse. Both state they understand instructions for discharge and medication.

## 2014-10-08 NOTE — Progress Notes (Signed)
Occupational Therapy Treatment Patient Details Name: Jeanette Gonzalez MRN: [id number] DOB: 23-Mar-1946 Today's Date: 10/08/2014    History of present illness 69 y.o. s/p PLIF L4-5,L5-S1.   OT comments  Patient progress towards goals, continue plan of care for now. Patient mod I within room for functional ambulation/mobility/transfers. Patient requires AE to increase independence with ADLs and functional transfers.    Follow Up Recommendations  No OT follow up;Supervision - Intermittent    Equipment Recommendations  3 in 1 bedside comode;Other (comment) (AE)    Recommendations for Other Services  None at this time    Precautions / Restrictions Precautions Precautions: Back Precaution Booklet Issued: No Precaution Comments: Able to verbalize and functionally adhere to 3/3 back precautions. Required Braces or Orthoses: Spinal Brace Spinal Brace: Lumbar corset;Applied in sitting position Restrictions Weight Bearing Restrictions: No       Mobility  Bed Mobility Overal bed mobility: Modified Independent Bed Mobility: Supine to Sit;Sit to Supine Rolling: Modified independent (Device/Increase time)   Supine to sit: Modified independent (Device/Increase time) Sit to supine: Modified independent (Device/Increase time)   General bed mobility comments: cues for technique/precaution.  Transfers Overall transfer level: Needs assistance Equipment used: Rolling walker (2 wheeled) Transfers: Sit to/from Stand Sit to Stand: Min guard         General transfer comment: Patient able to stand with bilateral hands on RW (unsafe) with supervision. When taught safest technique, patient required min guard when standing.     Balance - Per PT note Overall balance assessment: No apparent balance deficits (not formally assessed)                           High level balance activites: Braiding;Turns;Sudden stops;Head turns;Other (comment)     ADL Overall ADL's : Needs  assistance/impaired     Tub/ Shower Transfer: Supervision/safety;3 in 1;Rolling walker   Functional mobility during ADLs: Modified independent;Rolling walker General ADL Comments: Educated and demonstrated use of 3-in-1 in tub/shower unit using RW. Patient supervision for this and husband aware of needed intermittent supervision once discharged. Educated patient on energy conservation during ADL and IADL tasks.                 Cognition   Behavior During Therapy: WFL for tasks assessed/performed Overall Cognitive Status: Within Functional Limits for tasks assessed                 Pertinent Vitals/ Pain       Pain Assessment: Faces Faces Pain Scale: Hurts a little bit Pain Location: back Pain Descriptors / Indicators: Discomfort Pain Intervention(s): Monitored during session         Frequency Min 2X/week     Progress Toward Goals  OT Goals(current goals can now be found in the care plan section)  Progress towards OT goals: Progressing toward goals     Plan Discharge plan remains appropriate       End of Session Equipment Utilized During Treatment: Rolling walker;Back brace   Activity Tolerance Patient tolerated treatment well   Patient Left with call bell/phone within reach;in bed     Time: 0913-0932 OT Time Calculation (min): 19 min  Charges: OT General Charges $OT Visit: 1 Procedure OT Treatments $Self Care/Home Management : 8-22 mins  Latora Quarry , MS, OTR/L, CLT Pager: [phone]  10/08/2014, 9:46 AM

## 2014-10-08 NOTE — Progress Notes (Signed)
Physical Therapy Treatment Patient Details Name: Jeanette Gonzalez MRN: [id number] DOB: 18-Jun-1946 Today's Date: 10/08/2014    History of Present Illness 69 y.o. s/p PLIF L4-5,L5-S1.    PT Comments    Pt. With no leg buckling at this session and performed very well with high level balance challenges in dynamic functional activity. Pt. Reports that she will be d/c'd today to home and will follow up with MD in two weeks.  Follow Up Recommendations  Home health PT;Supervision/Assistance - 24 hour     Equipment Recommendations  Rolling walker with 5" wheels;3in1 (PT)    Recommendations for Other Services       Precautions / Restrictions Precautions Precautions: Back Precaution Booklet Issued: No Precaution Comments: Able to verbalize 3/3 back precautions. Required Braces or Orthoses: Spinal Brace Spinal Brace: Lumbar corset;Applied in sitting position Restrictions Weight Bearing Restrictions: No    Mobility  Bed Mobility Overal bed mobility: Modified Independent Bed Mobility: Supine to Sit;Sit to Supine Rolling: Modified independent (Device/Increase time)   Supine to sit: Modified independent (Device/Increase time) Sit to supine: Modified independent (Device/Increase time)   General bed mobility comments: cues for technique/precaution.  Transfers Overall transfer level: Needs assistance Equipment used: Rolling walker (2 wheeled) Transfers: Sit to/from Stand Sit to Stand: Min guard         General transfer comment: Pt.pushing down on walker to rise from bed increased use of UEs.  Ambulation/Gait Ambulation/Gait assistance: Supervision Ambulation Distance (Feet): 600 Feet Assistive device: Rolling walker (2 wheeled) Gait Pattern/deviations: Step-through pattern;Decreased stride length   Gait velocity interpretation: at or above normal speed for age/gender General Gait Details: Pt with slow, steady gait. No knee buckling.    Stairs Stairs: Yes Stairs  assistance: Min guard Stair Management: One rail Right;Step to pattern;Forwards Number of Stairs: 3 General stair comments: Pt. with no knee buclkling to day; 3 x 4  bouts last bout with only finger for support. cues for technique and sequencing. Spouse not present for family training.   Wheelchair Mobility    Modified Rankin (Stroke Patients Only)       Balance Overall balance assessment: No apparent balance deficits (not formally assessed)                           High level balance activites: Braiding;Turns;Sudden stops;Head turns;Other (comment)      Cognition Arousal/Alertness: Awake/alert Behavior During Therapy: WFL for tasks assessed/performed Overall Cognitive Status: Within Functional Limits for tasks assessed                      Exercises      General Comments        Pertinent Vitals/Pain Pain Assessment: Faces Faces Pain Scale: Hurts a little bit Pain Location: back Pain Descriptors / Indicators: Aching Pain Intervention(s): Monitored during session    Home Living                      Prior Function            PT Goals (current goals can now be found in the care plan section) Progress towards PT goals: Progressing toward goals    Frequency  Min 5X/week    PT Plan Current plan remains appropriate    Co-evaluation             End of Session Equipment Utilized During Treatment: Back brace Activity Tolerance: Patient tolerated treatment well Patient left: in  bed;with call bell/phone within reach     Time: 0755-0825 PT Time Calculation (min) (ACUTE ONLY): 30 min  Charges:                       G Codes:      Jodi Geralds, SPTA 10/08/2014, 9:10 AM

## 2014-10-08 NOTE — Discharge Instructions (Signed)
Spinal Fusion °Care After °Refer to this sheet in the next few weeks. These instructions provide you with information on caring for yourself after your procedure. Your caregiver may also give you more specific instructions. Your treatment has been planned according to current medical practices, but problems sometimes occur. Call your caregiver if you have any problems or questions after your procedure. °HOME CARE INSTRUCTIONS  °· Take whatever pain medicine has been prescribed by your caregiver. Do not take over-the-counter pain medicine unless directed otherwise by your caregiver. °· Do not drive if you are taking narcotic pain medicines. °· Change your bandage (dressing) if necessary or as directed by your caregiver. °· Do not get your surgical cut (incision) wet. After a few days you may take quick showers (rather than baths), but keep your incision clean and dry. Covering the incision with plastic wrap while you shower should keep your incision dry. A few weeks after surgery, once your incision has healed and your caregiver says it is okay, you can take baths or go swimming. °· If you have been prescribed medicine to prevent your blood from clotting, follow the directions carefully. °· Check the area around your incision often. Look for redness and swelling. Also, look for anything leaking from your wound. You can use a mirror or have a family member inspect your incision if it is in a place where it is difficult for you to see. °· Ask your caregiver what activities you should avoid and for how long. °· Walk as much as possible. °· Do not lift anything heavier than 10 pounds (4.5 kilograms) until your caregiver says it is safe. °· Do not twist or bend for a few weeks. Try not to pull on things. Avoid sitting for long periods of time. Change positions at least every hour. °· Ask your caregiver what kinds of exercise you should do to make your back stronger and when you should begin doing these exercises. °SEEK  IMMEDIATE MEDICAL CARE IF:  °· Pain suddenly becomes much worse. °· The incision area is red, swollen, bleeding, or leaking fluid. °· Your legs or feet become increasingly painful, numb, weak, or swollen. °· You have trouble controlling urination or bowel movements. °· You have trouble breathing. °· You have chest pain. °· You have a fever. °MAKE SURE YOU: °· Understand these instructions. °· Will watch your condition. °· Will get help right away if you are not doing well or get worse. °Document Released: 03/11/2005 Document Revised: 11/14/2011 Document Reviewed: 11/04/2010 °[employer]® Patient Information ©2015 [employer]. This information is not intended to replace advice given to you by your health care provider. Make sure you discuss any questions you have with your health care provider. ° °

## 2014-11-27 ENCOUNTER — Emergency Department (HOSPITAL_COMMUNITY)
Admission: EM | Admit: 2014-11-27 | Discharge: 2014-11-27 | Disposition: A | Discharge status: Home / Self Care | Payer: Medicare PPO | Attending: Emergency Medicine | Admitting: Emergency Medicine

## 2014-11-27 ENCOUNTER — Emergency Department (HOSPITAL_COMMUNITY): Payer: Medicare PPO

## 2014-11-27 ENCOUNTER — Encounter (HOSPITAL_COMMUNITY): Payer: Self-pay | Admitting: Radiology

## 2014-11-27 DIAGNOSIS — Z8719 Personal history of other diseases of the digestive system: Secondary | ICD-10-CM | POA: Insufficient documentation

## 2014-11-27 DIAGNOSIS — R630 Anorexia: Secondary | ICD-10-CM | POA: Diagnosis not present

## 2014-11-27 DIAGNOSIS — Z853 Personal history of malignant neoplasm of breast: Secondary | ICD-10-CM | POA: Insufficient documentation

## 2014-11-27 DIAGNOSIS — Z79899 Other long term (current) drug therapy: Secondary | ICD-10-CM | POA: Diagnosis not present

## 2014-11-27 DIAGNOSIS — Z8619 Personal history of other infectious and parasitic diseases: Secondary | ICD-10-CM | POA: Insufficient documentation

## 2014-11-27 DIAGNOSIS — R6883 Chills (without fever): Secondary | ICD-10-CM | POA: Insufficient documentation

## 2014-11-27 DIAGNOSIS — E860 Dehydration: Secondary | ICD-10-CM | POA: Diagnosis not present

## 2014-11-27 DIAGNOSIS — Z87442 Personal history of urinary calculi: Secondary | ICD-10-CM | POA: Diagnosis not present

## 2014-11-27 DIAGNOSIS — R5383 Other fatigue: Secondary | ICD-10-CM | POA: Diagnosis not present

## 2014-11-27 DIAGNOSIS — M1990 Unspecified osteoarthritis, unspecified site: Secondary | ICD-10-CM | POA: Diagnosis not present

## 2014-11-27 DIAGNOSIS — R110 Nausea: Secondary | ICD-10-CM | POA: Diagnosis not present

## 2014-11-27 DIAGNOSIS — R109 Unspecified abdominal pain: Secondary | ICD-10-CM | POA: Diagnosis not present

## 2014-11-27 DIAGNOSIS — R197 Diarrhea, unspecified: Secondary | ICD-10-CM | POA: Diagnosis not present

## 2014-11-27 DIAGNOSIS — Z85828 Personal history of other malignant neoplasm of skin: Secondary | ICD-10-CM | POA: Diagnosis not present

## 2014-11-27 DIAGNOSIS — R531 Weakness: Secondary | ICD-10-CM | POA: Diagnosis present

## 2014-11-27 LAB — COMPREHENSIVE METABOLIC PANEL
ALT: 13 U/L (ref 0–35)
AST: 25 U/L (ref 0–37)
Albumin: 3.4 g/dL — ABNORMAL LOW (ref 3.5–5.2)
Alkaline Phosphatase: 57 U/L (ref 39–117)
Anion gap: 12 (ref 5–15)
BILIRUBIN TOTAL: 0.8 mg/dL (ref 0.3–1.2)
BUN: 5 mg/dL — ABNORMAL LOW (ref 6–23)
CHLORIDE: 101 mmol/L (ref 96–112)
CO2: 24 mmol/L (ref 19–32)
Calcium: 9.9 mg/dL (ref 8.4–10.5)
Creatinine, Ser: 0.72 mg/dL (ref 0.50–1.10)
GFR, EST NON AFRICAN AMERICAN: 86 mL/min — AB (ref >90)
Glucose, Bld: 117 mg/dL — ABNORMAL HIGH (ref 70–99)
Potassium: 4.1 mmol/L (ref 3.5–5.1)
Sodium: 137 mmol/L (ref 135–145)
Total Protein: 7.6 g/dL (ref 6.0–8.3)

## 2014-11-27 LAB — CBC WITH DIFFERENTIAL/PLATELET
Basophils Absolute: 0 10*3/uL (ref 0.0–0.1)
Basophils Relative: 0 % (ref 0–1)
EOS ABS: 0 10*3/uL (ref 0.0–0.7)
EOS PCT: 0 % (ref 0–5)
HCT: 34.4 % — ABNORMAL LOW (ref 36.0–46.0)
Hemoglobin: 11.3 g/dL — ABNORMAL LOW (ref 12.0–15.0)
LYMPHS ABS: 1.2 10*3/uL (ref 0.7–4.0)
LYMPHS PCT: 17 % (ref 12–46)
MCH: 30.4 pg (ref 26.0–34.0)
MCHC: 32.8 g/dL (ref 30.0–36.0)
MCV: 92.5 fL (ref 78.0–100.0)
MONO ABS: 0.8 10*3/uL (ref 0.1–1.0)
Monocytes Relative: 11 % (ref 3–12)
Neutro Abs: 4.9 10*3/uL (ref 1.7–7.7)
Neutrophils Relative %: 72 % (ref 43–77)
Platelets: 387 10*3/uL (ref 150–400)
RBC: 3.72 MIL/uL — ABNORMAL LOW (ref 3.87–5.11)
RDW: 13.6 % (ref 11.5–15.5)
WBC: 6.9 10*3/uL (ref 4.0–10.5)

## 2014-11-27 LAB — I-STAT CG4 LACTIC ACID, ED: Lactic Acid, Venous: 0.76 mmol/L (ref 0.5–2.0)

## 2014-11-27 LAB — URINALYSIS, ROUTINE W REFLEX MICROSCOPIC
Bilirubin Urine: NEGATIVE
Glucose, UA: NEGATIVE mg/dL
Hgb urine dipstick: NEGATIVE
KETONES UR: 15 mg/dL — AB
Leukocytes, UA: NEGATIVE
NITRITE: NEGATIVE
Protein, ur: NEGATIVE mg/dL
Specific Gravity, Urine: 1.013 (ref 1.005–1.030)
UROBILINOGEN UA: 0.2 mg/dL (ref 0.0–1.0)
pH: 8 (ref 5.0–8.0)

## 2014-11-27 MED ORDER — IOHEXOL 300 MG/ML  SOLN
25.0000 mL | Freq: Once | INTRAMUSCULAR | Status: AC | PRN
  Administered 2014-11-27: 25 mL via ORAL

## 2014-11-27 MED ORDER — SODIUM CHLORIDE 0.9 % IV BOLUS (SEPSIS)
1000.0000 mL | Freq: Once | INTRAVENOUS | Status: AC
  Administered 2014-11-27: 1000 mL via INTRAVENOUS

## 2014-11-27 MED ORDER — IOHEXOL 300 MG/ML  SOLN
100.0000 mL | Freq: Once | INTRAMUSCULAR | Status: AC | PRN
  Administered 2014-11-27: 100 mL via INTRAVENOUS

## 2014-11-27 NOTE — ED Provider Notes (Signed)
CSN: [id number]     Arrival date & time 11/27/14  1309 History   First MD Initiated Contact with Patient 11/27/14 1639     Chief Complaint  Patient presents with  . Weakness     (Consider location/radiation/quality/duration/timing/severity/associated sxs/prior Treatment) HPI Comments: Patient presents to emergency department with chief complaint of abdominal pain and persistent diarrhea. Patient states that she was seen last night at the [facility] walk-in clinic. She was referred to the emergency department for profound dehydration and concerns for C. difficile infection. Patient reportedly had a back surgery in January, and states that she has had intermittent diarrhea and weakness and anorexia since. She denies any fevers chills. Denies any cough or shortness of breath. She denies any dysuria. She states that she has had multiple episodes diarrhea. She has had C. difficile in the past, and states that she feels similar. She was prescribed Flagyl by walking clinic, but reportedly was unable to tolerate the full prescription. She states that the anorexia and dehydration remains persistent. Regarding the diarrhea, she denies seeing any blood in her stool. There are no aggravating or alleviating factors.  The history is provided by the patient. No language interpreter was used.    Past Medical History  Diagnosis Date  . Osteopenia 04/2007  . Arthritis     spine  . Kidney stones   . GERD (gastroesophageal reflux disease)   . Malignant melanoma 12/2002    right elbow  . Basal cell carcinoma     x 2  . Breast cancer 07/1987   Past Surgical History  Procedure Laterality Date  . Breast surgery Right 1988    mastectomy, chemo  . Lasik    . Lumbar spine surgery  07/2009    repair lumbar spinal stenosis  . Eye surgery    . Tubal ligation  1979  . Colonoscopy w/ biopsies and polypectomy      x3 first one bx and polpys benign   Family History  Problem Relation Age of Onset  . Stroke Mother    . Cancer Father    History  Substance Use Topics  . Smoking status: Never Smoker   . Smokeless tobacco: Never Used  . Alcohol Use: No   OB History    Gravida Para Term Preterm AB TAB SAB Ectopic Multiple Living   1 1 1       1      Review of Systems  Constitutional: Positive for chills and fatigue. Negative for fever.  Respiratory: Negative for shortness of breath.   Cardiovascular: Negative for chest pain.  Gastrointestinal: Positive for nausea and diarrhea. Negative for vomiting and constipation.  Genitourinary: Negative for dysuria.  Neurological: Positive for weakness.  All other systems reviewed and are negative.     Allergies  Ancef; Cephalexin; Ciprofloxacin; and Prednisone  Home Medications   Prior to Admission medications   Medication Sig Start Date End Date Taking? Authorizing Provider  Lutein 40 MG CAPS Take 40 mg by mouth daily.   Yes Historical Provider, MD  Multiple Vitamin (MULTIVITAMIN WITH MINERALS) TABS tablet Take 1 tablet by mouth daily.   Yes Historical Provider, MD  ondansetron (ZOFRAN) 40 MG/20ML SOLN injection Inject 4 mg into the vein as needed for nausea or vomiting.    Yes Historical Provider, MD  Oxycodone HCl 10 MG TABS Take 10 mg by mouth as needed (for pain).  11/07/14  Yes Historical Provider, MD  Probiotic Product (PROBIOTIC DAILY PO) Take 1 capsule by mouth daily.  Yes Historical Provider, MD  Simethicone 180 MG CAPS Take 540 mg by mouth 2 (two) times daily.   Yes Historical Provider, MD  megestrol (MEGACE) 20 MG tablet Take 1 tablet (20 mg total) by mouth 2 (two) times daily. Patient not taking: Reported on 09/24/2014 03/27/13   Peri Maris, MD  oxyCODONE-acetaminophen (PERCOCET/ROXICET) 5-325 MG per tablet Take 1-2 tablets by mouth every 4 (four) hours as needed for moderate pain. 10/08/14   Karie Chimera, MD  Vitamin D, Ergocalciferol, (DRISDOL) 50000 UNITS CAPS Take 1 capsule (50,000 Units total) by mouth every 30 (thirty) days. Patient  taking differently: Take 50,000 Units by mouth every 30 (thirty) days. On the 1st of each month 03/27/13   Peri Maris, MD   BP 131/64 mmHg  Pulse 75  Temp(Src) 97.2 F (36.2 C) (Oral)  Resp 18  Ht 5\' 6"  (1.676 m)  Wt 131 lb (59.421 kg)  BMI 21.15 kg/m2  SpO2 98% Physical Exam  Constitutional: She is oriented to person, place, and time. She appears well-developed and well-nourished.  Thin  HENT:  Head: Normocephalic and atraumatic.  Very dry mucous membranes  Eyes: Conjunctivae and EOM are normal. Pupils are equal, round, and reactive to light.  Neck: Normal range of motion. Neck supple.  Cardiovascular: Normal rate and regular rhythm.  Exam reveals no gallop and no friction rub.   No murmur heard. Pulmonary/Chest: Effort normal and breath sounds normal. No respiratory distress. She has no wheezes. She has no rales. She exhibits no tenderness.  Clear to auscultation bilaterally  Abdominal: Soft. Bowel sounds are normal. She exhibits no distension and no mass. There is no tenderness. There is no rebound and no guarding.  Hyperactive bowel sounds, abdomen is diffusely tender to palpation  Musculoskeletal: Normal range of motion. She exhibits no edema or tenderness.  Neurological: She is alert and oriented to person, place, and time.  Skin: Skin is warm and dry.  Psychiatric: She has a normal mood and affect. Her behavior is normal. Judgment and thought content normal.  Nursing note and vitals reviewed.   ED Course  Procedures (including critical care time) Results for orders placed or performed during the hospital encounter of 11/27/14  CBC with Differential  Result Value Ref Range   WBC 6.9 4.0 - 10.5 K/uL   RBC 3.72 (L) 3.87 - 5.11 MIL/uL   Hemoglobin 11.3 (L) 12.0 - 15.0 g/dL   HCT 34.4 (L) 36.0 - 46.0 %   MCV 92.5 78.0 - 100.0 fL   MCH 30.4 26.0 - 34.0 pg   MCHC 32.8 30.0 - 36.0 g/dL   RDW 13.6 11.5 - 15.5 %   Platelets 387 150 - 400 K/uL   Neutrophils Relative %  72 43 - 77 %   Neutro Abs 4.9 1.7 - 7.7 K/uL   Lymphocytes Relative 17 12 - 46 %   Lymphs Abs 1.2 0.7 - 4.0 K/uL   Monocytes Relative 11 3 - 12 %   Monocytes Absolute 0.8 0.1 - 1.0 K/uL   Eosinophils Relative 0 0 - 5 %   Eosinophils Absolute 0.0 0.0 - 0.7 K/uL   Basophils Relative 0 0 - 1 %   Basophils Absolute 0.0 0.0 - 0.1 K/uL  Comprehensive metabolic panel  Result Value Ref Range   Sodium 137 135 - 145 mmol/L   Potassium 4.1 3.5 - 5.1 mmol/L   Chloride 101 96 - 112 mmol/L   CO2 24 19 - 32 mmol/L   Glucose, Bld  117 (H) 70 - 99 mg/dL   BUN 5 (L) 6 - 23 mg/dL   Creatinine, Ser 0.72 0.50 - 1.10 mg/dL   Calcium 9.9 8.4 - 10.5 mg/dL   Total Protein 7.6 6.0 - 8.3 g/dL   Albumin 3.4 (L) 3.5 - 5.2 g/dL   AST 25 0 - 37 U/L   ALT 13 0 - 35 U/L   Alkaline Phosphatase 57 39 - 117 U/L   Total Bilirubin 0.8 0.3 - 1.2 mg/dL   GFR calc non Af Amer 86 (L) >90 mL/min   GFR calc Af Amer >90 >90 mL/min   Anion gap 12 5 - 15  Urinalysis, Routine w reflex microscopic  Result Value Ref Range   Color, Urine YELLOW YELLOW   APPearance CLEAR CLEAR   Specific Gravity, Urine 1.013 1.005 - 1.030   pH 8.0 5.0 - 8.0   Glucose, UA NEGATIVE NEGATIVE mg/dL   Hgb urine dipstick NEGATIVE NEGATIVE   Bilirubin Urine NEGATIVE NEGATIVE   Ketones, ur 15 (A) NEGATIVE mg/dL   Protein, ur NEGATIVE NEGATIVE mg/dL   Urobilinogen, UA 0.2 0.0 - 1.0 mg/dL   Nitrite NEGATIVE NEGATIVE   Leukocytes, UA NEGATIVE NEGATIVE  I-Stat CG4 Lactic Acid, ED  Result Value Ref Range   Lactic Acid, Venous 0.76 0.5 - 2.0 mmol/L   Ct Abdomen Pelvis W Contrast  11/27/2014   CLINICAL DATA:  Acute onset of weakness, dehydration and sensation of coldness. Diarrhea and nausea. Initial encounter.  EXAM: CT ABDOMEN AND PELVIS WITH CONTRAST  TECHNIQUE: Multidetector CT imaging of the abdomen and pelvis was performed using the standard protocol following bolus administration of intravenous contrast.  CONTRAST:  123mL OMNIPAQUE IOHEXOL 300  MG/ML  SOLN  COMPARISON:  CT of the lumbar spine performed 07/17/2014  FINDINGS: Minimal bibasilar atelectasis or scarring is noted. A tiny hiatal hernia is noted. Apparent wall thickening at the fundus is stable from 2010. An unusual hemiazygos vein is noted, with contrast refluxing into the left kidney.  The liver and spleen are unremarkable in appearance. The gallbladder is within normal limits. The pancreas and adrenal glands are unremarkable.  The kidneys are unremarkable in appearance. There is no evidence of hydronephrosis. No renal or ureteral stones are seen. No perinephric stranding is appreciated.  No free fluid is identified. The small bowel is unremarkable in appearance. The stomach is within normal limits. No acute vascular abnormalities are seen.  The appendix contains air and is unremarkable appearance, without evidence for appendicitis. Contrast progresses to the level of the ascending colon. The transverse colon is mildly redundant. The colon is unremarkable in appearance.  The bladder is mildly distended and grossly unremarkable. The uterus is grossly unremarkable in appearance. The ovaries are relatively symmetric. No suspicious adnexal masses are seen. No inguinal lymphadenopathy is seen.  No acute osseous abnormalities are identified. The patient is status post lumbar spinal fusion at L3-L5. There is underlying minimal grade 1 retrolisthesis of L3 on L4 and mild grade 1 anterolisthesis of L4 on L5. Associated decompression is noted.  IMPRESSION: 1. No definite acute abnormality seen to explain the patient's symptoms. 2. Tiny hiatal hernia noted. 3. Mild degenerative change at the lower lumbar spine, with associated postoperative change.   Electronically Signed   By: Garald Balding M.D.   On: 11/27/2014 18:38      EKG Interpretation None      MDM   Final diagnoses:  Abdominal pain  Dehydration   Patient with reported dehydration, long history  of diarrhea and history of C.  difficile. Patient sent to emergency department by [facility]. Will give fluids, check labs, and will reassess. Patient does have significant abdominal pain which is reproducible with palpation, but otherwise nonexistent. Will check CT scan of the abdomen.  Patient states that she is feeling much better following fluids. Patient seen by and discussed with Dr. Zenia Resides. Patient wishes to be discharged. Feel that this is likely appropriate. Patient will follow-up with her primary care provider. Workup is reassuring today. Patient is stable and ready for discharge.    Montine Circle, PA-C 11/27/14 2055

## 2014-11-27 NOTE — ED Notes (Signed)
Oral contrast provided to patient.

## 2014-11-27 NOTE — ED Notes (Signed)
PA at bedside.

## 2014-11-27 NOTE — ED Notes (Signed)
Pt reports she was sent here from her pcp for c/o diarrhea (last on sun), nausea and generalized weakness. Pt reports that she had back surgery in January and that her wound on her back was not healing properly so her doctor gave her antibiotics. After having a few episodes of diarrhea her doctor was concerned for cdiff so they gave her flagyl which she took for a few days.

## 2014-11-27 NOTE — ED Provider Notes (Signed)
Medical screening examination/treatment/procedure(s) were conducted as a shared visit with non-physician practitioner(s) and myself.  I personally evaluated the patient during the encounter.   EKG Interpretation None     Patient here with weakness and diarrhea that stopped 4 days ago. Decreased oral intake. No fever or chills. Sent here for treatment of dehydration. Given IV fluids and feels better. Patient is not was in hospital at this time. Cultures sent and stable for discharge  Lacretia Leigh, MD 11/27/14 [phone]

## 2014-11-27 NOTE — ED Notes (Signed)
CT aware that patient finished oral contrast.

## 2014-11-27 NOTE — ED Notes (Signed)
Back surgery in Jan, diarrhea sat/sun, none since, nausea yesterday with weakness since, no pain, VSS, A/O , NAD

## 2014-11-27 NOTE — Discharge Instructions (Signed)
Dehydration, Adult °Dehydration is when you lose more fluids from the body than you take in. Vital organs like the kidneys, brain, and heart cannot function without a proper amount of fluids and salt. Any loss of fluids from the body can cause dehydration.  °CAUSES  °· Vomiting. °· Diarrhea. °· Excessive sweating. °· Excessive urine output. °· Fever. °SYMPTOMS  °Mild dehydration °· Thirst. °· Dry lips. °· Slightly dry mouth. °Moderate dehydration °· Very dry mouth. °· Sunken eyes. °· Skin does not bounce back quickly when lightly pinched and released. °· Dark urine and decreased urine production. °· Decreased tear production. °· Headache. °Severe dehydration °· Very dry mouth. °· Extreme thirst. °· Rapid, weak pulse (more than 100 beats per minute at rest). °· Cold hands and feet. °· Not able to sweat in spite of heat and temperature. °· Rapid breathing. °· Blue lips. °· Confusion and lethargy. °· Difficulty being awakened. °· Minimal urine production. °· No tears. °DIAGNOSIS  °Your caregiver will diagnose dehydration based on your symptoms and your exam. Blood and urine tests will help confirm the diagnosis. The diagnostic evaluation should also identify the cause of dehydration. °TREATMENT  °Treatment of mild or moderate dehydration can often be done at home by increasing the amount of fluids that you drink. It is best to drink small amounts of fluid more often. Drinking too much at one time can make vomiting worse. Refer to the home care instructions below. °Severe dehydration needs to be treated at the hospital where you will probably be given intravenous (IV) fluids that contain water and electrolytes. °HOME CARE INSTRUCTIONS  °· Ask your caregiver about specific rehydration instructions. °· Drink enough fluids to keep your urine clear or pale yellow. °· Drink small amounts frequently if you have nausea and vomiting. °· Eat as you normally do. °· Avoid: °¨ Foods or drinks high in sugar. °¨ Carbonated  drinks. °¨ Juice. °¨ Extremely hot or cold fluids. °¨ Drinks with caffeine. °¨ Fatty, greasy foods. °¨ Alcohol. °¨ Tobacco. °¨ Overeating. °¨ Gelatin desserts. °· Wash your hands well to avoid spreading bacteria and viruses. °· Only take over-the-counter or prescription medicines for pain, discomfort, or fever as directed by your caregiver. °· Ask your caregiver if you should continue all prescribed and over-the-counter medicines. °· Keep all follow-up appointments with your caregiver. °SEEK MEDICAL CARE IF: °· You have abdominal pain and it increases or stays in one area (localizes). °· You have a rash, stiff neck, or severe headache. °· You are irritable, sleepy, or difficult to awaken. °· You are weak, dizzy, or extremely thirsty. °SEEK IMMEDIATE MEDICAL CARE IF:  °· You are unable to keep fluids down or you get worse despite treatment. °· You have frequent episodes of vomiting or diarrhea. °· You have blood or green matter (bile) in your vomit. °· You have blood in your stool or your stool looks black and tarry. °· You have not urinated in 6 to 8 hours, or you have only urinated a small amount of very dark urine. °· You have a fever. °· You faint. °MAKE SURE YOU:  °· Understand these instructions. °· Will watch your condition. °· Will get help right away if you are not doing well or get worse. °Document Released: 08/22/2005 Document Revised: 11/14/2011 Document Reviewed: 04/11/2011 °[employer]® Patient Information ©2015 [employer]. This information is not intended to replace advice given to you by your health care provider. Make sure you discuss any questions you have with your health care   provider.  Abdominal Pain Many things can cause abdominal pain. Usually, abdominal pain is not caused by a disease and will improve without treatment. It can often be observed and treated at home. Your health care provider will do a physical exam and possibly order blood tests and X-rays to help determine the seriousness  of your pain. However, in many cases, more time must pass before a clear cause of the pain can be found. Before that point, your health care provider may not know if you need more testing or further treatment. HOME CARE INSTRUCTIONS  Monitor your abdominal pain for any changes. The following actions may help to alleviate any discomfort you are experiencing:  Only take over-the-counter or prescription medicines as directed by your health care provider.  Do not take laxatives unless directed to do so by your health care provider.  Try a clear liquid diet (broth, tea, or water) as directed by your health care provider. Slowly move to a bland diet as tolerated. SEEK MEDICAL CARE IF:  You have unexplained abdominal pain.  You have abdominal pain associated with nausea or diarrhea.  You have pain when you urinate or have a bowel movement.  You experience abdominal pain that wakes you in the night.  You have abdominal pain that is worsened or improved by eating food.  You have abdominal pain that is worsened with eating fatty foods.  You have a fever. SEEK IMMEDIATE MEDICAL CARE IF:   Your pain does not go away within 2 hours.  You keep throwing up (vomiting).  Your pain is felt only in portions of the abdomen, such as the right side or the left lower portion of the abdomen.  You pass bloody or black tarry stools. MAKE SURE YOU:  Understand these instructions.   Will watch your condition.   Will get help right away if you are not doing well or get worse.  Document Released: 06/01/2005 Document Revised: 08/27/2013 Document Reviewed: 05/01/2013 [facility] Patient Information 2015 [place], [place]. This information is not intended to replace advice given to you by your health care provider. Make sure you discuss any questions you have with your health care provider.

## 2014-11-29 LAB — URINE CULTURE

## 2014-12-16 DIAGNOSIS — H2513 Age-related nuclear cataract, bilateral: Secondary | ICD-10-CM | POA: Diagnosis not present

## 2014-12-16 DIAGNOSIS — H524 Presbyopia: Secondary | ICD-10-CM | POA: Diagnosis not present

## 2015-05-22 DIAGNOSIS — Z0000 Encounter for general adult medical examination without abnormal findings: Secondary | ICD-10-CM | POA: Diagnosis not present

## 2015-05-22 DIAGNOSIS — M5410 Radiculopathy, site unspecified: Secondary | ICD-10-CM | POA: Diagnosis not present

## 2015-05-22 DIAGNOSIS — Z85820 Personal history of malignant melanoma of skin: Secondary | ICD-10-CM | POA: Diagnosis not present

## 2015-05-22 DIAGNOSIS — Z86010 Personal history of colonic polyps: Secondary | ICD-10-CM | POA: Diagnosis not present

## 2015-05-22 DIAGNOSIS — G894 Chronic pain syndrome: Secondary | ICD-10-CM | POA: Diagnosis not present

## 2015-05-22 DIAGNOSIS — Z853 Personal history of malignant neoplasm of breast: Secondary | ICD-10-CM | POA: Diagnosis not present

## 2015-05-22 DIAGNOSIS — M47896 Other spondylosis, lumbar region: Secondary | ICD-10-CM | POA: Diagnosis not present

## 2015-05-22 DIAGNOSIS — Z23 Encounter for immunization: Secondary | ICD-10-CM | POA: Diagnosis not present

## 2015-05-22 DIAGNOSIS — M8580 Other specified disorders of bone density and structure, unspecified site: Secondary | ICD-10-CM | POA: Diagnosis not present

## 2015-05-22 DIAGNOSIS — R634 Abnormal weight loss: Secondary | ICD-10-CM | POA: Diagnosis not present

## 2015-05-22 DIAGNOSIS — Z136 Encounter for screening for cardiovascular disorders: Secondary | ICD-10-CM | POA: Diagnosis not present

## 2015-06-09 ENCOUNTER — Other Ambulatory Visit: Payer: Self-pay

## 2015-06-09 DIAGNOSIS — Z9011 Acquired absence of right breast and nipple: Secondary | ICD-10-CM

## 2015-06-09 DIAGNOSIS — Z1231 Encounter for screening mammogram for malignant neoplasm of breast: Secondary | ICD-10-CM

## 2015-07-03 DIAGNOSIS — R634 Abnormal weight loss: Secondary | ICD-10-CM | POA: Diagnosis not present

## 2015-07-03 DIAGNOSIS — M5410 Radiculopathy, site unspecified: Secondary | ICD-10-CM | POA: Diagnosis not present

## 2015-07-13 ENCOUNTER — Ambulatory Visit
Admission: RE | Admit: 2015-07-13 | Discharge: 2015-07-13 | Disposition: A | Discharge status: Home / Self Care | Payer: Medicare PPO | Source: Ambulatory Visit

## 2015-07-13 DIAGNOSIS — Z1231 Encounter for screening mammogram for malignant neoplasm of breast: Secondary | ICD-10-CM | POA: Diagnosis not present

## 2015-07-13 DIAGNOSIS — Z9011 Acquired absence of right breast and nipple: Secondary | ICD-10-CM

## 2015-07-21 DIAGNOSIS — D239 Other benign neoplasm of skin, unspecified: Secondary | ICD-10-CM | POA: Diagnosis not present

## 2016-06-14 ENCOUNTER — Other Ambulatory Visit: Payer: Self-pay | Admitting: Family Medicine

## 2016-06-14 DIAGNOSIS — Z1231 Encounter for screening mammogram for malignant neoplasm of breast: Secondary | ICD-10-CM

## 2016-07-18 ENCOUNTER — Ambulatory Visit
Admission: RE | Admit: 2016-07-18 | Discharge: 2016-07-18 | Disposition: A | Discharge status: Home / Self Care | Payer: Medicare Other | Source: Ambulatory Visit | Attending: Family Medicine | Admitting: Family Medicine

## 2016-07-18 DIAGNOSIS — Z1231 Encounter for screening mammogram for malignant neoplasm of breast: Secondary | ICD-10-CM

## 2016-12-29 ENCOUNTER — Encounter: Payer: Self-pay | Admitting: Emergency Medicine

## 2016-12-29 ENCOUNTER — Emergency Department: Payer: Medicare Other

## 2016-12-29 ENCOUNTER — Observation Stay
Admission: EM | Admit: 2016-12-29 | Discharge: 2016-12-30 | Disposition: A | Discharge status: Home / Self Care | Payer: Medicare Other | Attending: Internal Medicine | Admitting: Internal Medicine

## 2016-12-29 DIAGNOSIS — M1990 Unspecified osteoarthritis, unspecified site: Secondary | ICD-10-CM | POA: Diagnosis not present

## 2016-12-29 DIAGNOSIS — Z85820 Personal history of malignant melanoma of skin: Secondary | ICD-10-CM | POA: Diagnosis not present

## 2016-12-29 DIAGNOSIS — S32599A Other specified fracture of unspecified pubis, initial encounter for closed fracture: Secondary | ICD-10-CM | POA: Diagnosis present

## 2016-12-29 DIAGNOSIS — S90512A Abrasion, left ankle, initial encounter: Secondary | ICD-10-CM | POA: Diagnosis not present

## 2016-12-29 DIAGNOSIS — S32512A Fracture of superior rim of left pubis, initial encounter for closed fracture: Secondary | ICD-10-CM

## 2016-12-29 DIAGNOSIS — Z85828 Personal history of other malignant neoplasm of skin: Secondary | ICD-10-CM | POA: Insufficient documentation

## 2016-12-29 DIAGNOSIS — S32592A Other specified fracture of left pubis, initial encounter for closed fracture: Principal | ICD-10-CM | POA: Insufficient documentation

## 2016-12-29 DIAGNOSIS — Z79899 Other long term (current) drug therapy: Secondary | ICD-10-CM | POA: Insufficient documentation

## 2016-12-29 DIAGNOSIS — Z87442 Personal history of urinary calculi: Secondary | ICD-10-CM | POA: Insufficient documentation

## 2016-12-29 DIAGNOSIS — M8580 Other specified disorders of bone density and structure, unspecified site: Secondary | ICD-10-CM | POA: Diagnosis not present

## 2016-12-29 DIAGNOSIS — Y92511 Restaurant or cafe as the place of occurrence of the external cause: Secondary | ICD-10-CM | POA: Diagnosis not present

## 2016-12-29 DIAGNOSIS — Z853 Personal history of malignant neoplasm of breast: Secondary | ICD-10-CM | POA: Diagnosis not present

## 2016-12-29 DIAGNOSIS — S50312A Abrasion of left elbow, initial encounter: Secondary | ICD-10-CM | POA: Diagnosis not present

## 2016-12-29 DIAGNOSIS — K219 Gastro-esophageal reflux disease without esophagitis: Secondary | ICD-10-CM | POA: Diagnosis not present

## 2016-12-29 DIAGNOSIS — W109XXA Fall (on) (from) unspecified stairs and steps, initial encounter: Secondary | ICD-10-CM | POA: Insufficient documentation

## 2016-12-29 DIAGNOSIS — Z888 Allergy status to other drugs, medicaments and biological substances status: Secondary | ICD-10-CM | POA: Insufficient documentation

## 2016-12-29 LAB — CBC
HEMATOCRIT: 38.3 % (ref 35.0–47.0)
Hemoglobin: 13.2 g/dL (ref 12.0–16.0)
MCH: 33 pg (ref 26.0–34.0)
MCHC: 34.5 g/dL (ref 32.0–36.0)
MCV: 95.6 fL (ref 80.0–100.0)
Platelets: 210 10*3/uL (ref 150–440)
RBC: 4.01 MIL/uL (ref 3.80–5.20)
RDW: 13.3 % (ref 11.5–14.5)
WBC: 12.4 10*3/uL — ABNORMAL HIGH (ref 3.6–11.0)

## 2016-12-29 LAB — BASIC METABOLIC PANEL
Anion gap: 8 (ref 5–15)
BUN: 22 mg/dL — ABNORMAL HIGH (ref 6–20)
CHLORIDE: 104 mmol/L (ref 101–111)
CO2: 28 mmol/L (ref 22–32)
CREATININE: 0.81 mg/dL (ref 0.44–1.00)
Calcium: 9.9 mg/dL (ref 8.9–10.3)
GFR calc non Af Amer: >60 mL/min (ref >60)
GLUCOSE: 113 mg/dL — AB (ref 65–99)
Potassium: 4.1 mmol/L (ref 3.5–5.1)
Sodium: 140 mmol/L (ref 135–145)

## 2016-12-29 MED ORDER — SODIUM CHLORIDE 0.9 % IV BOLUS (SEPSIS)
1000.0000 mL | Freq: Once | INTRAVENOUS | Status: AC
  Administered 2016-12-29: 1000 mL via INTRAVENOUS

## 2016-12-29 MED ORDER — HYDROMORPHONE HCL 1 MG/ML IJ SOLN
0.5000 mg | Freq: Once | INTRAMUSCULAR | Status: AC
Start: 2016-12-29 — End: 2016-12-29
  Administered 2016-12-29: 0.5 mg via INTRAVENOUS

## 2016-12-29 MED ORDER — HYDROMORPHONE HCL 1 MG/ML IJ SOLN
INTRAMUSCULAR | Status: AC
  Administered 2016-12-29: 0.5 mg via INTRAVENOUS
  Filled 2016-12-29: qty 1

## 2016-12-29 MED ORDER — FENTANYL CITRATE (PF) 100 MCG/2ML IJ SOLN
75.0000 ug | Freq: Once | INTRAMUSCULAR | Status: AC
  Administered 2016-12-29: 75 ug via INTRAVENOUS
  Filled 2016-12-29: qty 2

## 2016-12-29 MED ORDER — MORPHINE SULFATE (PF) 2 MG/ML IV SOLN
2.0000 mg | INTRAVENOUS | Status: DC | PRN
  Administered 2016-12-30: 2 mg via INTRAVENOUS
  Filled 2016-12-29: qty 1

## 2016-12-29 NOTE — H&P (Signed)
Jeanette Gonzalez at [place] NAME: Jeanette Gonzalez    MR#:  [id number]  DATE OF BIRTH:  1945/12/02  DATE OF ADMISSION:  12/29/2016  PRIMARY CARE PHYSICIAN: Marda Stalker, PA-C   REQUESTING/REFERRING PHYSICIAN:   CHIEF COMPLAINT:   Chief Complaint  Patient presents with  . Hip Pain    HISTORY OF PRESENT ILLNESS: Jeanette Gonzalez  is a 71 y.o. female with a known history of basal cell carcinoma, breast cancer, GERD, nephrolithiasis, malignant melanoma, osteopenia presented to the emergency room after she had a fall this evening. Patient was eating at a [employer] daily at [facility] and when she stepped out of the daily she did not watch his step she lost balance and fell. Patient fell on her hip and she has hip pain. She has groin pain and left hip pain. She was evaluated in the emergency room and imaging studies showed a superior and inferior pubic ramus fracture on the left side. No history of any head injury and loss of consciousness after the fall. No fever or chills and cough. No complaints of any chest pain, shortness of breath. Hospitalist service was consulted for further care of the patient. Patient had intractable pain in the left hip she was not able to ambulate much and hence hospitalist service was consulted for pain management and admission.  PAST MEDICAL HISTORY:   Past Medical History:  Diagnosis Date  . Arthritis    spine  . Basal cell carcinoma    x 2  . Breast cancer ([place]) 07/1987  . GERD (gastroesophageal reflux disease)   . Kidney stones   . Malignant melanoma ([place]) 12/2002   right elbow  . Osteopenia 04/2007    PAST SURGICAL HISTORY: Past Surgical History:  Procedure Laterality Date  . BREAST SURGERY Right 1988   mastectomy, chemo  . COLONOSCOPY W/ BIOPSIES AND POLYPECTOMY     x3 first one bx and polpys benign  . EYE SURGERY    . LASIK    . LUMBAR SPINE SURGERY  07/2009   repair lumbar spinal stenosis  .  TUBAL LIGATION  1979    SOCIAL HISTORY:  Social History  Substance Use Topics  . Smoking status: Never Smoker  . Smokeless tobacco: Never Used  . Alcohol use No    FAMILY HISTORY:  Family History  Problem Relation Age of Onset  . Stroke Mother   . Cancer Father     DRUG ALLERGIES:  Allergies  Allergen Reactions  . Ancef [Cefazolin] Diarrhea    Developed C-dif when given with cipro  . Cephalexin Diarrhea  . Ciprofloxacin Diarrhea    Developed C-dif when given with ancef  . Prednisone Palpitations and Other (See Comments)    "felt strange" , increased heart rate    REVIEW OF SYSTEMS:   CONSTITUTIONAL: No fever, fatigue or weakness.  EYES: No blurred or double vision.  EARS, NOSE, AND THROAT: No tinnitus or ear pain.  RESPIRATORY: No cough, shortness of breath, wheezing or hemoptysis.  CARDIOVASCULAR: No chest pain, orthopnea, edema.  GASTROINTESTINAL: No nausea, vomiting, diarrhea or abdominal pain.  GENITOURINARY: No dysuria, hematuria.  ENDOCRINE: No polyuria, nocturia,  HEMATOLOGY: No anemia, easy bruising or bleeding SKIN: No rash or lesion. MUSCULOSKELETAL: left hip and groin pain  NEUROLOGIC: No tingling, numbness, weakness.  PSYCHIATRY: No anxiety or depression.   MEDICATIONS AT HOME:  Prior to Admission medications   Medication Sig Start Date End Date Taking? Authorizing Provider  Lutein 40  MG CAPS Take 40 mg by mouth daily.   Yes Historical Provider, MD  Multiple Vitamin (MULTIVITAMIN WITH MINERALS) TABS tablet Take 1 tablet by mouth daily.   Yes Historical Provider, MD  Simethicone 180 MG CAPS Take 540 mg by mouth 2 (two) times daily.   Yes Historical Provider, MD      PHYSICAL EXAMINATION:   VITAL SIGNS: Blood pressure 111/64, pulse 80, temperature 98.4 F (36.9 C), temperature source Oral, resp. rate (!) 23, height 5\' 7"  (1.702 m), weight 63.5 kg (140 lb), SpO2 90 %.  GENERAL:  71 y.o.-year-old patient lying in the bed with no acute distress.   EYES: Pupils equal, round, reactive to light and accommodation. No scleral icterus. Extraocular muscles intact.  HEENT: Head atraumatic, normocephalic. Oropharynx and nasopharynx clear.  NECK:  Supple, no jugular venous distention. No thyroid enlargement, no tenderness.  LUNGS: Normal breath sounds bilaterally, no wheezing, rales,rhonchi or crepitation. No use of accessory muscles of respiration.  CARDIOVASCULAR: S1, S2 normal. No murmurs, rubs, or gallops.  ABDOMEN: Soft, nontender, nondistended. Bowel sounds present. No organomegaly or mass.  EXTREMITIES: No pedal edema, cyanosis, or clubbing.  Tenderness noted left hip NEUROLOGIC: Cranial nerves II through XII are intact. Muscle strength 5/5 in all extremities. Sensation intact. Gait not checked.  PSYCHIATRIC: The patient is alert and oriented x 3.  SKIN: No obvious rash, lesion, or ulcer.   LABORATORY PANEL:   CBC  Recent Labs Lab 12/29/16 2129  WBC 12.4*  HGB 13.2  HCT 38.3  PLT 210  MCV 95.6  MCH 33.0  MCHC 34.5  RDW 13.3   ------------------------------------------------------------------------------------------------------------------  Chemistries   Recent Labs Lab 12/29/16 2129  NA 140  K 4.1  CL 104  CO2 28  GLUCOSE 113*  BUN 22*  CREATININE 0.81  CALCIUM 9.9   ------------------------------------------------------------------------------------------------------------------ estimated creatinine clearance is 61.9 mL/min (by C-G formula based on SCr of 0.81 mg/dL). ------------------------------------------------------------------------------------------------------------------ No results for input(s): TSH, T4TOTAL, T3FREE, THYROIDAB in the last 72 hours.  Invalid input(s): FREET3   Coagulation profile No results for input(s): INR, PROTIME in the last 168 hours. ------------------------------------------------------------------------------------------------------------------- No results for input(s):  DDIMER in the last 72 hours. -------------------------------------------------------------------------------------------------------------------  Cardiac Enzymes No results for input(s): CKMB, TROPONINI, MYOGLOBIN in the last 168 hours.  Invalid input(s): CK ------------------------------------------------------------------------------------------------------------------ Invalid input(s): POCBNP  ---------------------------------------------------------------------------------------------------------------  Urinalysis    Component Value Date/Time   COLORURINE YELLOW 11/27/2014 [place] 11/27/2014 1716   LABSPEC 1.013 11/27/2014 1716   PHURINE 8.0 11/27/2014 1716   GLUCOSEU NEGATIVE 11/27/2014 1716   HGBUR NEGATIVE 11/27/2014 1716   BILIRUBINUR NEGATIVE 11/27/2014 1716   BILIRUBINUR n 03/27/2013 1045   KETONESUR 15 (A) 11/27/2014 1716   PROTEINUR NEGATIVE 11/27/2014 1716   UROBILINOGEN 0.2 11/27/2014 1716   NITRITE NEGATIVE 11/27/2014 1716   LEUKOCYTESUR NEGATIVE 11/27/2014 1716     RADIOLOGY: Ct Hip Left Wo Contrast  Result Date: 12/29/2016 CLINICAL DATA:  Small abrasion the left elbow, left hip and groin pain, unable to bear weight EXAM: CT OF THE LEFT HIP WITHOUT CONTRAST TECHNIQUE: Multidetector CT imaging of the left hip was performed according to the standard protocol. Multiplanar CT image reconstructions were also generated. COMPARISON:  Radiograph 12/29/2016, CT 11/27/2014 FINDINGS: Bones/Joint/Cartilage Displaced and slightly overriding left inferior pubic ramus fracture. Slightly comminuted and impacted/mildly displaced fracture of the right superior pubic ramus. No definite extension of fracture lucency to the articular surface of the left hip joint. There is no evidence for a left proximal  femoral fracture. No dislocation. There is additional nondisplaced fracture of the left anterior sacrum with extension to the left SI joint. No widening of the SI  joint. Ligaments Suboptimally assessed by CT. Muscles and Tendons Normal muscle bulk for age. Soft tissues Mild soft tissue stranding anterior to the left SI joint. Sigmoid colon diverticular disease. IMPRESSION: 1. Acute, displaced and slightly overriding left inferior pubic ramus fracture. Impacted, slightly comminuted and displaced left superior pubic ramus fracture without extension of fracture lucency to the articular surface of the left hip joint. 2. Nondisplaced fracture of the peripheral anterior sacrum with extension to the left SI joint. Left SI joint does not appear significantly widened. Electronically Signed   By: Donavan Foil M.D.   On: 12/29/2016 22:21   Dg Hip Unilat W Or Wo Pelvis 2-3 Views Left  Result Date: 12/29/2016 CLINICAL DATA:  Left hip and groin pain after fall today. EXAM: DG HIP (WITH OR WITHOUT PELVIS) 2-3V LEFT COMPARISON:  None. FINDINGS: Severely displaced fracture is noted involving the left superior pubic ramus at its junction with the acetabulum. There is no definite evidence of fracture involving the left proximal femur. No dislocation is noted. Mildly displaced left inferior pubic ramus fracture is noted as well. No significant degenerative joint disease is seen involving either hip joint. Status post surgical fusion of lower lumbar spine. IMPRESSION: Severely displaced fracture is seen involving the left superior pubic ramus at its junction with acetabulum, as well as mildly displaced left inferior pubic ramus fracture. CT scan of the pelvis is recommended to evaluate for possible acetabular involvement. Electronically Signed   By: Marijo Conception, M.D.   On: 12/29/2016 20:38    EKG: Orders placed or performed during the hospital encounter of 12/29/16  . ED EKG  . ED EKG  . EKG 12-Lead  . EKG 12-Lead    IMPRESSION AND PLAN: 71 year old female patient with history of arthritis, basal cell carcinoma, malignant melanoma, GERD, osteopenia presented to the emergency  room for fall. Admitting diagnosis 1. Displaced left superior pubic ramus fracture 2. Inferior pubic ramus fracture 3. Accidental fall 4. Left hip pain 5. Osteopenia Treatment plan Admit patient to observation bed Pain management with oral Percocet and IV Dilaudid Orthopedic evaluation in the morning Calcium supplements DVT prophylaxis with subcutaneous Lovenox 40 MG daily  supportive care  All the records are reviewed and case discussed with ED provider. Management plans discussed with the patient, family and they are in agreement.  CODE STATUS:FULL CODE Surrogate decision maker : Husband Code Status History    Date Active Date Inactive Code Status Order ID Comments User Context   10/02/2014  4:01 PM 10/08/2014  1:48 PM Full Code [id number]  Faythe Ghee, MD Inpatient       TOTAL TIME TAKING CARE OF THIS PATIENT: 50 minutes.    Saundra Shelling M.D on 12/29/2016 at 11:46 PM  Between 7am to 6pm - Pager - [phone]  After 6pm go to [email or web address] - password [email or web address] [facility]  [facility] [facility]  Office  [phone]  CC: Primary care physician; Marda Stalker, PA-C

## 2016-12-29 NOTE — ED Notes (Signed)
Pt states need to urinate, pt placed on bedpan but unable to urinate at this time.  Pt reports severe pain with movement when being placed on bedpan.  MD informed

## 2016-12-29 NOTE — ED Triage Notes (Signed)
Pt reports leaving restaurant and tripped approx 100min PTA landing on left hip; small abrasion noted to left elbow and c/o left hip/groin pain with inability to bear weight; pt assisted onto stretcher and taken to an exam room for further evaluation by charge nurse

## 2016-12-29 NOTE — ED Notes (Signed)
Pt returned from CT °

## 2016-12-29 NOTE — ED Provider Notes (Signed)
[facility] Emergency Department Provider Note  ____________________________________________  Time seen: Approximately 8:01 PM  I have reviewed the triage vital signs and the nursing notes.   HISTORY  Chief Complaint Hip Pain    HPI Jeanette Gonzalez is a 71 y.o. female with a history of osteopenia presenting with left hip pain after fall. The patient reports that she was walking backwards out of a restaurant and didn't realize there was a step, resulting in a fall onto her left hip. She also abraded her left elbow. No loss of consciousness, neck or back pain. No headache, nausea or vomiting. The patient was unable to walk after the fall. Anemia movement exacerbates the left hip pain. She denies any numbness or tingling.Last tetanus less than 5 years ago.   Past Medical History:  Diagnosis Date  . Arthritis    spine  . Basal cell carcinoma    x 2  . Breast cancer ([place]) 07/1987  . GERD (gastroesophageal reflux disease)   . Kidney stones   . Malignant melanoma ([place]) 12/2002   right elbow  . Osteopenia 04/2007    Patient Active Problem List   Diagnosis Date Noted  . Scoliosis of lumbar spine 10/02/2014    Past Surgical History:  Procedure Laterality Date  . BREAST SURGERY Right 1988   mastectomy, chemo  . COLONOSCOPY W/ BIOPSIES AND POLYPECTOMY     x3 first one bx and polpys benign  . EYE SURGERY    . LASIK    . LUMBAR SPINE SURGERY  07/2009   repair lumbar spinal stenosis  . TUBAL LIGATION  1979    Current Outpatient Rx  . Order #: [id number] Class: Historical Med  . Order #: [id number] Class: Normal  . Order #: [id number] Class: Historical Med  . Order #: [id number] Class: Historical Med  . Order #: [id number] Class: Historical Med  . Order #: [id number] Class: Print  . Order #: [id number] Class: Historical Med  . Order #: [id number] Class: Historical Med  . Order #: [id number] Class: Normal    Allergies Ancef [cefazolin]; Cephalexin; Ciprofloxacin;  and Prednisone  Family History  Problem Relation Age of Onset  . Stroke Mother   . Cancer Father     Social History Social History  Substance Use Topics  . Smoking status: Never Smoker  . Smokeless tobacco: Never Used  . Alcohol use No    Review of Systems Constitutional: No fever/chills. Positive mechanical fall. No lightheadedness or syncope. No loss of consciousness. Eyes: No visual changes. ENT:  No congestion or rhinorrhea. Cardiovascular: Denies chest pain. Denies palpitations. Respiratory: Denies shortness of breath.  No cough. Gastrointestinal: No abdominal pain.  No nausea, no vomiting.  No diarrhea.  No constipation. Genitourinary: Negative for dysuria. Musculoskeletal: Negative for back pain. Positive left hip pain. Skin: Negative for rash. Positive left elbow abrasion. Positive left ankle abrasion. Neurological: Negative for headaches. No focal numbness, tingling or weakness.   10-point ROS otherwise negative.  ____________________________________________   PHYSICAL EXAM:  VITAL SIGNS: ED Triage Vitals  Enc Vitals Group     BP 12/29/16 1959 139/66     Pulse Rate 12/29/16 1959 65     Resp 12/29/16 1959 18     Temp 12/29/16 1959 98.4 F (36.9 C)     Temp Source 12/29/16 1959 Oral     SpO2 12/29/16 1959 100 %     Weight 12/29/16 1950 140 lb (63.5 kg)     Height 12/29/16 1950 5\' 7"  (1.702 m)     Head Circumference --  Peak Flow --      Pain Score 12/29/16 1950 10     Pain Loc --      Pain Edu? --      Excl. in [place]? --     Constitutional: Alert and oriented. Uncomfortable appearing but nontoxic. Answers questions appropriately. Eyes: Conjunctivae are normal.  EOMI. No scleral icterus. Head: Atraumatic. Nose: No congestion/rhinnorhea. Mouth/Throat: Mucous membranes are moist.  Neck: No stridor.  Supple.  No midline C-spine tenderness to palpation, step-offs or deformities Cardiovascular: Normal rate, regular rhythm. No murmurs, rubs or gallops.   Respiratory: Normal respiratory effort.  No accessory muscle use or retractions. Lungs CTAB.  No wheezes, rales or ronchi. Gastrointestinal: Soft, nontender and nondistended.  No guarding or rebound.  No peritoneal signs. Musculoskeletal: Full range of motion of the bilateral wrists, elbows, shoulders, ankles, right knee and right hip. Significant pain with any movement of the left hip and therefore the knee cannot be evaluated.  Normal DP and PT pulses bilaterally.  Normal sensation to light touch. Neurologic:  A&Ox3.  Speech is clear.  Face and smile are symmetric.  EOMI.  Moves all extremities well. Skin:  Skin is warm, dry. 1 x 1 cm abrasion over the lateral malleolus without surrounding swelling, ecchymosis. 1 x 1.5cm abrasion over the left elbow that is hemostatic without significant swelling Psychiatric: Mood and affect are normal. Speech and behavior are normal.  Normal judgement.  ____________________________________________   LABS (all labs ordered are listed, but only abnormal results are displayed)  Labs Reviewed  CBC - Abnormal; Notable for the following:       Result Value   WBC 12.4 (*)    All other components within normal limits  BASIC METABOLIC PANEL - Abnormal; Notable for the following:    Glucose, Bld 113 (*)    BUN 22 (*)    All other components within normal limits   ____________________________________________  EKG  ED ECG REPORT I, Eula Listen, the attending physician, personally viewed and interpreted this ECG.   Date: 12/29/2016  EKG Time: 2039  Rate: 90  Rhythm: normal sinus rhythm  Axis: normal  Intervals:first-degree A-V block   ST&T Change: No STEMI  ____________________________________________  RADIOLOGY  Ct Hip Left Wo Contrast  Result Date: 12/29/2016 CLINICAL DATA:  Small abrasion the left elbow, left hip and groin pain, unable to bear weight EXAM: CT OF THE LEFT HIP WITHOUT CONTRAST TECHNIQUE: Multidetector CT imaging of the  left hip was performed according to the standard protocol. Multiplanar CT image reconstructions were also generated. COMPARISON:  Radiograph 12/29/2016, CT 11/27/2014 FINDINGS: Bones/Joint/Cartilage Displaced and slightly overriding left inferior pubic ramus fracture. Slightly comminuted and impacted/mildly displaced fracture of the right superior pubic ramus. No definite extension of fracture lucency to the articular surface of the left hip joint. There is no evidence for a left proximal femoral fracture. No dislocation. There is additional nondisplaced fracture of the left anterior sacrum with extension to the left SI joint. No widening of the SI joint. Ligaments Suboptimally assessed by CT. Muscles and Tendons Normal muscle bulk for age. Soft tissues Mild soft tissue stranding anterior to the left SI joint. Sigmoid colon diverticular disease. IMPRESSION: 1. Acute, displaced and slightly overriding left inferior pubic ramus fracture. Impacted, slightly comminuted and displaced left superior pubic ramus fracture without extension of fracture lucency to the articular surface of the left hip joint. 2. Nondisplaced fracture of the peripheral anterior sacrum with extension to the left SI joint. Left SI joint  does not appear significantly widened. Electronically Signed   By: Donavan Foil M.D.   On: 12/29/2016 22:21   Dg Hip Unilat W Or Wo Pelvis 2-3 Views Left  Result Date: 12/29/2016 CLINICAL DATA:  Left hip and groin pain after fall today. EXAM: DG HIP (WITH OR WITHOUT PELVIS) 2-3V LEFT COMPARISON:  None. FINDINGS: Severely displaced fracture is noted involving the left superior pubic ramus at its junction with the acetabulum. There is no definite evidence of fracture involving the left proximal femur. No dislocation is noted. Mildly displaced left inferior pubic ramus fracture is noted as well. No significant degenerative joint disease is seen involving either hip joint. Status post surgical fusion of lower  lumbar spine. IMPRESSION: Severely displaced fracture is seen involving the left superior pubic ramus at its junction with acetabulum, as well as mildly displaced left inferior pubic ramus fracture. CT scan of the pelvis is recommended to evaluate for possible acetabular involvement. Electronically Signed   By: Marijo Conception, M.D.   On: 12/29/2016 20:38    ____________________________________________   PROCEDURES  Procedure(s) performed: None  Procedures  Critical Care performed: No ____________________________________________   INITIAL IMPRESSION / ASSESSMENT AND PLAN / ED COURSE  Pertinent labs & imaging results that were available during my care of the patient were reviewed by me and considered in my medical decision making (see chart for details).  71 y.o. female with a history of osteopenia presenting with left hip pain after fall that was mechanical. There is no evidence of syncope or presyncope. Plan to get an x-ray of the patient's left hip and initiate immediate pain control. Plan reevaluation for final disposition.  ----------------------------------------- 9:00 PM on 12/29/2016 -----------------------------------------  The patient's pelvis x-ray shows the following: Severely displaced fracture is seen involving the left superior pubic ramus at its junction with acetabulum, as well as mildly displaced left inferior pubic ramus fracture. CT scan of the pelvis is recommended to evaluate for possible acetabular involvement.  I have paged the orthopedist for further recommendation.  ----------------------------------------- 9:35 PM on 12/29/2016 -----------------------------------------  Dr. Sabra Heck, the orthopedist on call, recommended CT imaging to evaluate for acetabular involvement. If there is minimal or no acetabular involvement, the patient can be discharged home as she is able to tolerate pain associated with her fractures. If there is significant acetabular  involvement, the patient will need transfer to a trauma center that has an orthopedist who operates on pelvic fractures.  ----------------------------------------- 10:40 PM on 12/29/2016 -----------------------------------------  The patient's CT does not show significant acetabular involvement. It has been reviewed by Dr. Sabra Heck, who states the patient is safe for discharge if she is able to tolerate pain. However, the patient is not even able to get on a bed pan without excruciating pain so will plan to admit her for pain control. ____________________________________________  FINAL CLINICAL IMPRESSION(S) / ED DIAGNOSES  Final diagnoses:  Abrasion of left ankle, initial encounter  Abrasion of left elbow, initial encounter  Closed fracture of left inferior pubic ramus, initial encounter ([place])  Closed fracture of left superior pubic ramus, initial encounter ([place])         NEW MEDICATIONS STARTED DURING THIS VISIT:  New Prescriptions   No medications on file      Eula Listen, MD 12/29/16 2240

## 2016-12-29 NOTE — ED Triage Notes (Signed)
Pt tripped on a curve and fell onto her left hip co pain to area. No other complaints but unable to bear weight.

## 2016-12-30 MED ORDER — LUTEIN 40 MG PO CAPS
40.0000 mg | ORAL_CAPSULE | Freq: Every day | ORAL | Status: DC
  Filled 2016-12-30: qty 1

## 2016-12-30 MED ORDER — ADULT MULTIVITAMIN W/MINERALS CH
1.0000 | ORAL_TABLET | Freq: Every day | ORAL | Status: DC
  Administered 2016-12-30: 1 via ORAL
  Filled 2016-12-30: qty 1

## 2016-12-30 MED ORDER — HYDROCODONE-ACETAMINOPHEN 5-325 MG PO TABS
1.0000 | ORAL_TABLET | ORAL | Status: DC | PRN
  Administered 2016-12-30: 2 via ORAL
  Administered 2016-12-30: 1 via ORAL
  Administered 2016-12-30: 2 via ORAL
  Filled 2016-12-30: qty 2
  Filled 2016-12-30: qty 1
  Filled 2016-12-30: qty 2

## 2016-12-30 MED ORDER — ACETAMINOPHEN 325 MG PO TABS: 650.0000 mg | ORAL_TABLET | Freq: Four times a day (QID) | ORAL | Status: DC | PRN

## 2016-12-30 MED ORDER — ACETAMINOPHEN 650 MG RE SUPP: 650.0000 mg | Freq: Four times a day (QID) | RECTAL | Status: DC | PRN

## 2016-12-30 MED ORDER — SIMETHICONE 80 MG PO CHEW: 80.0000 mg | CHEWABLE_TABLET | Freq: Four times a day (QID) | ORAL | Status: DC | PRN

## 2016-12-30 MED ORDER — ENOXAPARIN SODIUM 40 MG/0.4ML ~~LOC~~ SOLN
40.0000 mg | SUBCUTANEOUS | Status: DC
  Administered 2016-12-30: 40 mg via SUBCUTANEOUS
  Filled 2016-12-30: qty 0.4

## 2016-12-30 MED ORDER — ONDANSETRON HCL 4 MG/2ML IJ SOLN
4.0000 mg | Freq: Four times a day (QID) | INTRAMUSCULAR | Status: DC | PRN
Start: 2016-12-30 — End: 2016-12-30

## 2016-12-30 MED ORDER — ONDANSETRON HCL 4 MG PO TABS: 4.0000 mg | ORAL_TABLET | Freq: Four times a day (QID) | ORAL | Status: DC | PRN

## 2016-12-30 MED ORDER — SODIUM CHLORIDE 0.9% FLUSH
3.0000 mL | Freq: Two times a day (BID) | INTRAVENOUS | Status: DC
  Administered 2016-12-30 (×2): 3 mL via INTRAVENOUS

## 2016-12-30 MED ORDER — SODIUM CHLORIDE 0.9% FLUSH: 3.0000 mL | INTRAVENOUS | Status: DC | PRN

## 2016-12-30 MED ORDER — HYDROCODONE-ACETAMINOPHEN 5-325 MG PO TABS: 1.0000 | ORAL_TABLET | ORAL | 0 refills | Status: AC | PRN

## 2016-12-30 MED ORDER — SENNOSIDES-DOCUSATE SODIUM 8.6-50 MG PO TABS: 1.0000 | ORAL_TABLET | Freq: Every evening | ORAL | Status: DC | PRN

## 2016-12-30 MED ORDER — SODIUM CHLORIDE 0.9 % IV SOLN: 250.0000 mL | INTRAVENOUS | Status: DC | PRN

## 2016-12-30 MED ORDER — OCUVITE-LUTEIN PO CAPS
1.0000 | ORAL_CAPSULE | Freq: Every day | ORAL | Status: DC
  Administered 2016-12-30: 1 via ORAL
  Filled 2016-12-30: qty 1

## 2016-12-30 MED ORDER — DOCUSATE SODIUM 100 MG PO CAPS: 100.0000 mg | ORAL_CAPSULE | Freq: Two times a day (BID) | ORAL | Status: DC

## 2016-12-30 NOTE — Care Management Note (Addendum)
Case Management Note  Patient Details  Name: Jeanette Gonzalez MRN: [id number] Date of Birth: [birth year]  Subjective/Objective:  Spoke with patient regarding home health PT. She prefers to go to out patient PT. Discussed with Dr. Posey Pronto. She will give patient and OP RX. PT pending prior to dc today. Patetint has a cane and a walker from a previous back surgery.   Action/Plan:   Expected Discharge Date:                  Expected Discharge Plan:  OP Rehab  In-House Referral:     Discharge planning Services  CM Consult  Post Acute Care Choice:    Choice offered to:     DME Arranged:    DME Agency:     HH Arranged:    HH Agency:     Status of Service:  Completed, signed off  If discussed at [employer] of Stay Meetings, dates discussed:    Additional Comments:  Jolly Mango, RN 12/30/2016, 11:30 AM

## 2016-12-30 NOTE — Care Management Note (Signed)
Case Management Note  Patient Details  Name: JOSIAH WOJTASZEK MRN: [id number] Date of Birth: 1946/01/21  Subjective/Objective:  Met with patient at bedside to discuss discharge planning. Prior to admission, patient was independent, active and required no DME. She lives with her spouse. PCP is Marda Stalker, NP [place], [place] [phone].                      Action/Plan: Ortho and PT consult pending. Following progression.   Expected Discharge Date:                  Expected Discharge Plan:  [place]  In-House Referral:     Discharge planning Services  CM Consult  Post Acute Care Choice:    Choice offered to:     DME Arranged:    DME Agency:     HH Arranged:    [place] Agency:     Status of Service:  In process, will continue to follow  If discussed at Long Length of Stay Meetings, dates discussed:    Additional Comments:  Jolly Mango, RN 12/30/2016, 9:41 AM

## 2016-12-30 NOTE — Progress Notes (Signed)
Pt has participated in physical therapy, was medicated for pain during the session. This Probation officer dc'd pt's piv from her [place] with catheter intact, pt tolerated well. This Probation officer then reviewed d/c instructions with pt and family member at bedside, pt verbalized understanding, and received scripts for pain medication and PT. Pt escorted to front entrance of facility at this time via [place].

## 2016-12-30 NOTE — Evaluation (Signed)
Physical Therapy Evaluation Patient Details Name: Jeanette Gonzalez MRN: [id number] DOB: 07-04-1946 Today's Date: 12/30/2016   History of Present Illness  presented to ER status post mechanical fall; admitted with L inferior and superior pubic rami fracture, L anterior sacrum fracture extending into L SIJ. TTWB per Dr. Sabra Heck.  Clinical Impression  Upon evaluation, patient alert and oriented; follows all commands and demonstrates good insight/safety awareness with all functional activities.  Good UB control/strength required to maintain TTWB with all functional activities.  Able to complete all mobiltiy tasks (supine/sit, sit/stand, basic transfers, gait and stairs) with RW, no greater than min assist +1-2 for safety.  Good confidence with movement; good ability to maintain adherence to [facility] restrictions.  Do recommend +2 on stairs for optimal safety.  Patient/husband voiced understanding. Would benefit from skilled PT to address above deficits and promote optimal return to PLOF; recommend transition to STR upon discharge from acute hospitalization.    Follow Up Recommendations Home health PT    Equipment Recommendations  Rolling walker with 5" wheels    Recommendations for Other Services       Precautions / Restrictions Precautions Precautions: Fall Restrictions Weight Bearing Restrictions: Yes LLE Weight Bearing: Touchdown weight bearing      Mobility  Bed Mobility Overal bed mobility: Needs Assistance Bed Mobility: Supine to Sit     Supine to sit: Min assist        Transfers Overall transfer level: Needs assistance Equipment used: Rolling walker (2 wheeled) Transfers: Sit to/from Stand Sit to Stand: Min assist         General transfer comment: cuing for hand placement; effortful movement transition; good UB strength  Ambulation/Gait Ambulation/Gait assistance: Min guard;Min assist Ambulation Distance (Feet): 50 Feet Assistive device: Rolling walker (2  wheeled)       General Gait Details: step to gait pattern, good UB strength and ability to maintain TTWB L LE  Stairs Stairs: Yes Stairs assistance: Min assist;+2 physical assistance;+2 safety/equipment Stair Management: With walker Number of Stairs: 3 General stair comments: retro-ambulation on stairs; +1 for balance when negotiation steps, +1 from second person (husband) to stabilize RW.  good safety/stability; patient/husband voice understanding of technique   Wheelchair Mobility    Modified Rankin (Stroke Patients Only)       Balance Overall balance assessment: Needs assistance Sitting-balance support: No upper extremity supported;Feet supported Sitting balance-Leahy Scale: Good     Standing balance support: Bilateral upper extremity supported Standing balance-Leahy Scale: Fair                               Pertinent Vitals/Pain Pain Assessment: 0-10 Pain Score: 4  Pain Location: L hip Pain Descriptors / Indicators: Aching;Grimacing;Guarding Pain Intervention(s): Limited activity within patient's tolerance;Monitored during session;Repositioned;Patient requesting pain meds-RN notified    Home Living Family/patient expects to be discharged to:: Private residence Living Arrangements: Spouse/significant other Available Help at Discharge: Family;Available 24 hours/day Type of Home: House Home Access: Stairs to enter Entrance Stairs-Rails: Right Entrance Stairs-Number of Steps: 3 Home Layout: One level Home Equipment: Walker - 2 wheels;Cane - single point Additional Comments: has access to manual WC if needed    Prior Function Level of Independence: Independent         Comments: Indep with ADLs, household and community mobility; denies fall history outside of this event.     Hand Dominance        Extremity/Trunk Assessment   Upper Extremity  Assessment Upper Extremity Assessment: Overall WFL for tasks assessed    Lower Extremity  Assessment Lower Extremity Assessment:  (L hip grossly 3-/5, limited by pain)       Communication      Cognition Arousal/Alertness: Awake/alert Behavior During Therapy: WFL for tasks assessed/performed Overall Cognitive Status: Within Functional Limits for tasks assessed                                        General Comments      Exercises Other Exercises Other Exercises: Vebally reviewed TTWB and functional implications, car transfer technique and home safety modifications; patient/husband voiced understanding and agreement. Other Exercises: Toilet transfer, ambulatory with RW, cga/min assist; cuing for hand placement with sit/stand.  Encouraged to maintain unilateral UE support on RW during standing tasks to maintain balance.   Assessment/Plan    PT Assessment Patient needs continued PT services  PT Problem List Decreased strength;Decreased activity tolerance;Decreased balance;Decreased range of motion;Decreased mobility;Decreased knowledge of use of DME;Decreased safety awareness;Decreased knowledge of precautions;Pain       PT Treatment Interventions DME instruction;Gait training;Stair training;Functional mobility training;Therapeutic activities;Therapeutic exercise;Balance training;Patient/family education    PT Goals (Current goals can be found in the Care Plan section)  Acute Rehab PT Goals Patient Stated Goal: to return home PT Goal Formulation: With patient/family Time For Goal Achievement: 01/13/17 Potential to Achieve Goals: Good    Frequency 7X/week   Barriers to discharge        Co-evaluation               End of Session Equipment Utilized During Treatment: Gait belt Activity Tolerance: Patient tolerated treatment well Patient left: with call bell/phone within reach;in bed;with bed alarm set;with family/visitor present Nurse Communication: Mobility status PT Visit Diagnosis: Pain;Muscle weakness (generalized) (M62.81);Unsteadiness  on feet (R26.81) Pain - Right/Left: Left Pain - part of body: Hip    Time: 7425-9563 PT Time Calculation (min) (ACUTE ONLY): 31 min   Charges:   PT Evaluation $PT Eval Low Complexity: 1 Procedure PT Treatments $Gait Training: 8-22 mins $Therapeutic Activity: 8-22 mins   PT G Codes:   PT G-Codes **NOT FOR INPATIENT CLASS** Functional Assessment Tool Used: AM-PAC 6 Clicks Basic Mobility Functional Limitation: Mobility: Walking and moving around Mobility: Walking and Moving Around Current Status (O7564): At least 40 percent but less than 60 percent impaired, limited or restricted Mobility: Walking and Moving Around Goal Status [phone]): At least 20 percent but less than 40 percent impaired, limited or restricted    Madalee Altmann H. Owens Shark, PT, DPT, NCS 12/30/16, 3:36 PM [phone]

## 2016-12-30 NOTE — Discharge Summary (Signed)
[facility] NAME: Jeanette Gonzalez    MR#:  [id number]  DATE OF BIRTH:  1946/06/09  DATE OF ADMISSION:  12/29/2016 ADMITTING PHYSICIAN: Saundra Shelling, MD  DATE OF DISCHARGE: 12/30/16  PRIMARY CARE PHYSICIAN: Marda Stalker, PA-C    ADMISSION DIAGNOSIS:  Abrasion of left elbow, initial encounter [S50.312A] Abrasion of left ankle, initial encounter [S90.512A] Closed fracture of left inferior pubic ramus, initial encounter ([place]) [S32.592A] Closed fracture of left superior pubic ramus, initial encounter ([place]) [S32.512A]  DISCHARGE DIAGNOSIS:   Left pubic fracture s/p fall, mechanical SECONDARY DIAGNOSIS:   Past Medical History:  Diagnosis Date  . Arthritis    spine  . Basal cell carcinoma    x 2  . Breast cancer ([place]) 07/1987  . GERD (gastroesophageal reflux disease)   . Kidney stones   . Malignant melanoma ([place]) 12/2002   right elbow  . Osteopenia 04/2007    HOSPITAL COURSE:   Jeanette Gonzalez  is a 71 y.o. female with a known history of basal cell carcinoma, breast cancer, GERD, nephrolithiasis, malignant melanoma, osteopenia presented to the emergency room after she had a fall this evening. Patient was eating at a [employer] daily at [facility] and when she stepped out of the daily she did not watch his step she lost balance and fell. Patient fell on her hip and she has hip pain. She has groin pain and left hip pain. She was evaluated in the emergency room and imaging studies showed a superior and inferior pubic ramus fracture on the left side  1. Acute left pubic rami fracture s/p mechanical fall -prn pain meds -PT to see. -pt prefers to do PT as out pt if possible  2. h/o Arthritis  3. DVT prophylaxis lovenox  D/c home [place] today after seen by PT. CONSULTS OBTAINED:  Treatment Team:  Earnestine Leys, MD  DRUG ALLERGIES:   Allergies  Allergen Reactions  . Ancef [Cefazolin] Diarrhea    Developed  C-dif when given with cipro  . Cephalexin Diarrhea  . Ciprofloxacin Diarrhea    Developed C-dif when given with ancef  . Prednisone Palpitations and Other (See Comments)    "felt strange" , increased heart rate    DISCHARGE MEDICATIONS:   Current Discharge Medication List    START taking these medications   Details  HYDROcodone-acetaminophen (NORCO/VICODIN) 5-325 MG tablet Take 1-2 tablets by mouth every 4 (four) hours as needed for moderate pain. Qty: 30 tablet, Refills: 0      CONTINUE these medications which have NOT CHANGED   Details  Lutein 40 MG CAPS Take 40 mg by mouth daily.    Multiple Vitamin (MULTIVITAMIN WITH MINERALS) TABS tablet Take 1 tablet by mouth daily.    Simethicone 180 MG CAPS Take 540 mg by mouth 2 (two) times daily.        If you experience worsening of your admission symptoms, develop shortness of breath, life threatening emergency, suicidal or homicidal thoughts you must seek medical attention immediately by calling 911 or calling your MD immediately  if symptoms less severe.  You Must read complete instructions/literature along with all the possible adverse reactions/side effects for all the Medicines you take and that have been prescribed to you. Take any new Medicines after you have completely understood and accept all the possible adverse reactions/side effects.   Please note  You were cared for by a hospitalist during your hospital stay. If you have any questions about your discharge  medications or the care you received while you were in the hospital after you are discharged, you can call the unit and asked to speak with the hospitalist on call if the hospitalist that took care of you is not available. Once you are discharged, your primary care physician will handle any further medical issues. Please note that NO REFILLS for any discharge medications will be authorized once you are discharged, as it is imperative that you return to your primary care  physician (or establish a relationship with a primary care physician if you do not have one) for your aftercare needs so that they can reassess your need for medications and monitor your lab values. Today   SUBJECTIVE   Hip pain with movement  VITAL SIGNS:  Blood pressure (!) 119/50, pulse 68, temperature 98.4 F (36.9 C), temperature source Oral, resp. rate 18, height 5\' 7"  (1.702 m), weight 63.5 kg (140 lb), SpO2 98 %.  I/O:   Intake/Output Summary (Last 24 hours) at 12/30/16 1154 Last data filed at 12/30/16 0938  Gross per 24 hour  Intake              360 ml  Output              400 ml  Net              -40 ml    PHYSICAL EXAMINATION:  GENERAL:  71 y.o.-year-old patient lying in the bed with no acute distress.  EYES: Pupils equal, round, reactive to light and accommodation. No scleral icterus. Extraocular muscles intact.  HEENT: Head atraumatic, normocephalic. Oropharynx and nasopharynx clear.  NECK:  Supple, no jugular venous distention. No thyroid enlargement, no tenderness.  LUNGS: Normal breath sounds bilaterally, no wheezing, rales,rhonchi or crepitation. No use of accessory muscles of respiration.  CARDIOVASCULAR: S1, S2 normal. No murmurs, rubs, or gallops.  ABDOMEN: Soft, non-tender, non-distended. Bowel sounds present. No organomegaly or mass.  EXTREMITIES: No pedal edema, cyanosis, or clubbing.  NEUROLOGIC: Cranial nerves II through XII are intact. Muscle strength 5/5 in all extremities. Sensation intact. Gait not checked.  PSYCHIATRIC: The patient is alert and oriented x 3.  SKIN: No obvious rash, lesion, or ulcer.   DATA REVIEW:   CBC   Recent Labs Lab 12/29/16 2129  WBC 12.4*  HGB 13.2  HCT 38.3  PLT 210    Chemistries   Recent Labs Lab 12/29/16 2129  NA 140  K 4.1  CL 104  CO2 28  GLUCOSE 113*  BUN 22*  CREATININE 0.81  CALCIUM 9.9    Microbiology Results   No results found for this or any previous visit (from the past 240  hour(s)).  RADIOLOGY:  Ct Hip Left Wo Contrast  Result Date: 12/29/2016 CLINICAL DATA:  Small abrasion the left elbow, left hip and groin pain, unable to bear weight EXAM: CT OF THE LEFT HIP WITHOUT CONTRAST TECHNIQUE: Multidetector CT imaging of the left hip was performed according to the standard protocol. Multiplanar CT image reconstructions were also generated. COMPARISON:  Radiograph 12/29/2016, CT 11/27/2014 FINDINGS: Bones/Joint/Cartilage Displaced and slightly overriding left inferior pubic ramus fracture. Slightly comminuted and impacted/mildly displaced fracture of the right superior pubic ramus. No definite extension of fracture lucency to the articular surface of the left hip joint. There is no evidence for a left proximal femoral fracture. No dislocation. There is additional nondisplaced fracture of the left anterior sacrum with extension to the left SI joint. No widening of the SI joint. Ligaments  Suboptimally assessed by CT. Muscles and Tendons Normal muscle bulk for age. Soft tissues Mild soft tissue stranding anterior to the left SI joint. Sigmoid colon diverticular disease. IMPRESSION: 1. Acute, displaced and slightly overriding left inferior pubic ramus fracture. Impacted, slightly comminuted and displaced left superior pubic ramus fracture without extension of fracture lucency to the articular surface of the left hip joint. 2. Nondisplaced fracture of the peripheral anterior sacrum with extension to the left SI joint. Left SI joint does not appear significantly widened. Electronically Signed   By: Donavan Foil M.D.   On: 12/29/2016 22:21   Dg Hip Unilat W Or Wo Pelvis 2-3 Views Left  Result Date: 12/29/2016 CLINICAL DATA:  Left hip and groin pain after fall today. EXAM: DG HIP (WITH OR WITHOUT PELVIS) 2-3V LEFT COMPARISON:  None. FINDINGS: Severely displaced fracture is noted involving the left superior pubic ramus at its junction with the acetabulum. There is no definite evidence of  fracture involving the left proximal femur. No dislocation is noted. Mildly displaced left inferior pubic ramus fracture is noted as well. No significant degenerative joint disease is seen involving either hip joint. Status post surgical fusion of lower lumbar spine. IMPRESSION: Severely displaced fracture is seen involving the left superior pubic ramus at its junction with acetabulum, as well as mildly displaced left inferior pubic ramus fracture. CT scan of the pelvis is recommended to evaluate for possible acetabular involvement. Electronically Signed   By: Marijo Conception, M.D.   On: 12/29/2016 20:38     Management plans discussed with the patient, family and they are in agreement.  CODE STATUS:     Code Status Orders        Start     Ordered   12/30/16 0024  Full code  Continuous     12/30/16 0023    Code Status History    Date Active Date Inactive Code Status Order ID Comments User Context   10/02/2014  4:01 PM 10/08/2014  1:48 PM Full Code [id number]  Faythe Ghee, MD Inpatient    Advance Directive Documentation     Most Recent Value  Type of Advance Directive  Healthcare Power of [place], Living will  Pre-existing out of facility DNR order (yellow form or pink MOST form)  -  "MOST" Form in Place?  -      TOTAL TIME TAKING CARE OF THIS PATIENT: *40* minutes.    Veronique Warga M.D on 12/30/2016 at 11:54 AM  Between 7am to 6pm - Pager - [phone] After 6pm go to [email or web address] - password [email or web address] [facility]  Office  [phone]  CC: Primary care physician; Marda Stalker, PA-C

## 2016-12-30 NOTE — Consult Note (Signed)
ORTHOPAEDIC CONSULTATION  REQUESTING PHYSICIAN: Fritzi Mandes, MD  Chief Complaint: Left pelvic pain  HPI: Jeanette CODERRE is a 71 y.o. female who complains of  left pelvic pain after a fall outside a restaurant last night.  Patient had a trip with no loss of consciousness.  She was evaluated in the emergency room where x-rays exam and CT of the pelvis show fractures of the left superior and inferior pubic rami.  There is no acetabular involvement.  There is a chip of the sacrum.  Patient states that her Medicare  does not cover her about this hospitalization and wishes to go home.  I advised her to be toe-touch weightbearing on the left leg for the time being.  Should ice the pelvic area.  She should return to my office in 7-10 days for exam.  Past Medical History:  Diagnosis Date  . Arthritis    spine  . Basal cell carcinoma    x 2  . Breast cancer ([place]) 07/1987  . GERD (gastroesophageal reflux disease)   . Kidney stones   . Malignant melanoma ([place]) 12/2002   right elbow  . Osteopenia 04/2007   Past Surgical History:  Procedure Laterality Date  . BREAST SURGERY Right 1988   mastectomy, chemo  . COLONOSCOPY W/ BIOPSIES AND POLYPECTOMY     x3 first one bx and polpys benign  . EYE SURGERY    . LASIK    . LUMBAR SPINE SURGERY  07/2009   repair lumbar spinal stenosis  . TUBAL LIGATION  1979   Social History   Social History  . Marital status: Married    Spouse name: N/A  . Number of children: N/A  . Years of education: N/A   Occupational History  . retired    Social History Main Topics  . Smoking status: Never Smoker  . Smokeless tobacco: Never Used  . Alcohol use No  . Drug use: No  . Sexual activity: Yes    Partners: Male    Birth control/ protection: Post-menopausal   Other Topics Concern  . None   Social History Narrative  . None   Family History  Problem Relation Age of Onset  . Stroke Mother   . Cancer Father    Allergies  Allergen Reactions  .  Ancef [Cefazolin] Diarrhea    Developed C-dif when given with cipro  . Cephalexin Diarrhea  . Ciprofloxacin Diarrhea    Developed C-dif when given with ancef  . Prednisone Palpitations and Other (See Comments)    "felt strange" , increased heart rate   Prior to Admission medications   Medication Sig Start Date End Date Taking? Authorizing Provider  Lutein 40 MG CAPS Take 40 mg by mouth daily.   Yes Historical Provider, MD  Multiple Vitamin (MULTIVITAMIN WITH MINERALS) TABS tablet Take 1 tablet by mouth daily.   Yes Historical Provider, MD  Simethicone 180 MG CAPS Take 540 mg by mouth 2 (two) times daily.   Yes Historical Provider, MD  HYDROcodone-acetaminophen (NORCO/VICODIN) 5-325 MG tablet Take 1-2 tablets by mouth every 4 (four) hours as needed for moderate pain. 12/30/16   Fritzi Mandes, MD   Ct Hip Left Wo Contrast  Result Date: 12/29/2016 CLINICAL DATA:  Small abrasion the left elbow, left hip and groin pain, unable to bear weight EXAM: CT OF THE LEFT HIP WITHOUT CONTRAST TECHNIQUE: Multidetector CT imaging of the left hip was performed according to the standard protocol. Multiplanar CT image reconstructions were also generated. COMPARISON:  Radiograph 12/29/2016, CT 11/27/2014 FINDINGS: Bones/Joint/Cartilage Displaced and slightly overriding left inferior pubic ramus fracture. Slightly comminuted and impacted/mildly displaced fracture of the right superior pubic ramus. No definite extension of fracture lucency to the articular surface of the left hip joint. There is no evidence for a left proximal femoral fracture. No dislocation. There is additional nondisplaced fracture of the left anterior sacrum with extension to the left SI joint. No widening of the SI joint. Ligaments Suboptimally assessed by CT. Muscles and Tendons Normal muscle bulk for age. Soft tissues Mild soft tissue stranding anterior to the left SI joint. Sigmoid colon diverticular disease. IMPRESSION: 1. Acute, displaced and  slightly overriding left inferior pubic ramus fracture. Impacted, slightly comminuted and displaced left superior pubic ramus fracture without extension of fracture lucency to the articular surface of the left hip joint. 2. Nondisplaced fracture of the peripheral anterior sacrum with extension to the left SI joint. Left SI joint does not appear significantly widened. Electronically Signed   By: Donavan Foil M.D.   On: 12/29/2016 22:21   Dg Hip Unilat W Or Wo Pelvis 2-3 Views Left  Result Date: 12/29/2016 CLINICAL DATA:  Left hip and groin pain after fall today. EXAM: DG HIP (WITH OR WITHOUT PELVIS) 2-3V LEFT COMPARISON:  None. FINDINGS: Severely displaced fracture is noted involving the left superior pubic ramus at its junction with the acetabulum. There is no definite evidence of fracture involving the left proximal femur. No dislocation is noted. Mildly displaced left inferior pubic ramus fracture is noted as well. No significant degenerative joint disease is seen involving either hip joint. Status post surgical fusion of lower lumbar spine. IMPRESSION: Severely displaced fracture is seen involving the left superior pubic ramus at its junction with acetabulum, as well as mildly displaced left inferior pubic ramus fracture. CT scan of the pelvis is recommended to evaluate for possible acetabular involvement. Electronically Signed   By: Marijo Conception, M.D.   On: 12/29/2016 20:38    Positive ROS: All other systems have been reviewed and were otherwise negative with the exception of those mentioned in the HPI and as above.  Physical Exam: General: Alert, no acute distress Cardiovascular: No pedal edema Respiratory: No cyanosis, no use of accessory musculature GI: No organomegaly, abdomen is soft and non-tender Skin: No lesions in the area of chief complaint Neurologic: Sensation intact distally Psychiatric: Patient is competent for consent with normal mood and affect Lymphatic: No axillary or  cervical lymphadenopathy  MUSCULOSKELETAL: Patient is alert and sitting up at the side of her bed.  She is dressed and ready to go home.  She has some pain with movement of the left hip but is not severe.  Neurovascular status good.  No other injuries are noted.  Assessment: Left pelvic fractures  Plan: Touchdown weightbearing with walker on left. Return to clinic 10 days to 2 weeks. Ice to painful areas    Park Breed, MD [phone]   12/30/2016 1:04 PM

## 2016-12-30 NOTE — Care Management Obs Status (Signed)
[facility] NOTIFICATION   Patient Details  Name: [name] MRN: [id number] Date of Birth: [birth year]   Medicare Observation Status Notification Given:  Yes    Jolly Mango, RN 12/30/2016, 9:36 AM

## 2017-06-15 ENCOUNTER — Other Ambulatory Visit: Payer: Self-pay | Admitting: Family Medicine

## 2017-06-15 DIAGNOSIS — Z1239 Encounter for other screening for malignant neoplasm of breast: Secondary | ICD-10-CM

## 2017-07-19 ENCOUNTER — Ambulatory Visit
Admission: RE | Admit: 2017-07-19 | Discharge: 2017-07-19 | Disposition: A | Discharge status: Home / Self Care | Payer: Medicare Other | Source: Ambulatory Visit | Attending: Family Medicine | Admitting: Family Medicine

## 2017-07-19 DIAGNOSIS — Z1239 Encounter for other screening for malignant neoplasm of breast: Secondary | ICD-10-CM

## 2017-07-19 HISTORY — DX: Personal history of antineoplastic chemotherapy: Z92.21

## 2018-07-05 ENCOUNTER — Other Ambulatory Visit: Payer: Self-pay | Admitting: Family Medicine

## 2018-07-05 DIAGNOSIS — Z1231 Encounter for screening mammogram for malignant neoplasm of breast: Secondary | ICD-10-CM

## 2018-08-17 ENCOUNTER — Ambulatory Visit
Admission: RE | Admit: 2018-08-17 | Discharge: 2018-08-17 | Disposition: A | Discharge status: Home / Self Care | Payer: Medicare Other | Source: Ambulatory Visit | Attending: Family Medicine | Admitting: Family Medicine

## 2018-08-17 DIAGNOSIS — Z1231 Encounter for screening mammogram for malignant neoplasm of breast: Secondary | ICD-10-CM

## 2019-06-24 ENCOUNTER — Other Ambulatory Visit: Payer: Self-pay | Admitting: Family Medicine

## 2019-06-24 DIAGNOSIS — M8580 Other specified disorders of bone density and structure, unspecified site: Secondary | ICD-10-CM

## 2019-08-15 ENCOUNTER — Other Ambulatory Visit: Payer: Self-pay | Admitting: Family Medicine

## 2019-08-15 DIAGNOSIS — M810 Age-related osteoporosis without current pathological fracture: Secondary | ICD-10-CM

## 2019-08-15 DIAGNOSIS — Z780 Asymptomatic menopausal state: Secondary | ICD-10-CM

## 2019-09-09 ENCOUNTER — Other Ambulatory Visit: Payer: Self-pay | Admitting: Family Medicine

## 2019-09-09 DIAGNOSIS — Z1231 Encounter for screening mammogram for malignant neoplasm of breast: Secondary | ICD-10-CM

## 2019-10-05 ENCOUNTER — Ambulatory Visit: Payer: Medicare Other

## 2019-10-10 ENCOUNTER — Ambulatory Visit: Payer: Medicare PPO | Attending: Internal Medicine

## 2019-10-10 DIAGNOSIS — Z23 Encounter for immunization: Secondary | ICD-10-CM | POA: Insufficient documentation

## 2019-10-10 NOTE — Progress Notes (Signed)
   Covid-19 Vaccination Clinic  Name:  Jeanette Gonzalez    MRN: [id number] DOB: 07-11-1946  10/10/2019  [name] was observed post Covid-19 immunization for 15 minutes without incidence. She was provided with Vaccine Information Sheet and instruction to access the V-Safe system.   [name] was instructed to call 911 with any severe reactions post vaccine: Marland Kitchen Difficulty breathing  . Swelling of your face and throat  . A fast heartbeat  . A bad rash all over your body  . Dizziness and weakness    Immunizations Administered    Name Date Dose VIS Date Route   Pfizer COVID-19 Vaccine 10/10/2019  8:58 AM 0.3 mL 08/16/2019 Intramuscular   Manufacturer: [place]   Lot: [id number]   [place]: [id number]

## 2019-10-26 ENCOUNTER — Ambulatory Visit: Payer: Medicare Other

## 2019-11-04 ENCOUNTER — Ambulatory Visit: Payer: Medicare PPO | Attending: Internal Medicine

## 2019-11-04 DIAGNOSIS — Z23 Encounter for immunization: Secondary | ICD-10-CM

## 2019-11-04 NOTE — Progress Notes (Signed)
   Covid-19 Vaccination Clinic  Name:  Jeanette Gonzalez    MRN: [id number] DOB: 10-Nov-1945  11/04/2019  [name] was observed post Covid-19 immunization for 15 minutes without incidence. She was provided with Vaccine Information Sheet and instruction to access the V-Safe system.   [name] was instructed to call 911 with any severe reactions post vaccine: Marland Kitchen Difficulty breathing  . Swelling of your face and throat  . A fast heartbeat  . A bad rash all over your body  . Dizziness and weakness    Immunizations Administered    Name Date Dose VIS Date Route   Pfizer COVID-19 Vaccine 11/04/2019  9:01 AM 0.3 mL 08/16/2019 Intramuscular   Manufacturer: [place]   Lot: [id number]   [place]: [id number]

## 2019-11-28 ENCOUNTER — Ambulatory Visit
Admission: RE | Admit: 2019-11-28 | Discharge: 2019-11-28 | Disposition: A | Discharge status: Home / Self Care | Payer: Medicare PPO | Source: Ambulatory Visit | Attending: Family Medicine | Admitting: Family Medicine

## 2019-11-28 ENCOUNTER — Other Ambulatory Visit: Payer: Self-pay

## 2019-11-28 DIAGNOSIS — M810 Age-related osteoporosis without current pathological fracture: Secondary | ICD-10-CM

## 2019-11-28 DIAGNOSIS — Z1231 Encounter for screening mammogram for malignant neoplasm of breast: Secondary | ICD-10-CM

## 2019-11-28 DIAGNOSIS — Z780 Asymptomatic menopausal state: Secondary | ICD-10-CM

## 2020-08-12 ENCOUNTER — Encounter: Payer: Self-pay | Admitting: Physician Assistant

## 2020-08-12 ENCOUNTER — Other Ambulatory Visit: Payer: Self-pay

## 2020-08-12 ENCOUNTER — Ambulatory Visit (INDEPENDENT_AMBULATORY_CARE_PROVIDER_SITE_OTHER): Payer: Medicare PPO | Admitting: Physician Assistant

## 2020-08-12 DIAGNOSIS — Z1283 Encounter for screening for malignant neoplasm of skin: Secondary | ICD-10-CM | POA: Diagnosis not present

## 2020-08-12 DIAGNOSIS — Z85820 Personal history of malignant melanoma of skin: Secondary | ICD-10-CM

## 2020-08-12 DIAGNOSIS — L821 Other seborrheic keratosis: Secondary | ICD-10-CM | POA: Diagnosis not present

## 2020-08-12 DIAGNOSIS — D1800 Hemangioma unspecified site: Secondary | ICD-10-CM

## 2020-08-12 DIAGNOSIS — Z85828 Personal history of other malignant neoplasm of skin: Secondary | ICD-10-CM

## 2020-08-12 DIAGNOSIS — L720 Epidermal cyst: Secondary | ICD-10-CM

## 2020-08-12 NOTE — Progress Notes (Signed)
   Follow-Up Visit   Subjective  Jeanette Gonzalez is a 74 y.o. female who presents for the following: Annual Exam (left hand- bumps- come and go).   The following portions of the chart were reviewed this encounter and updated as appropriate: Tobacco  Allergies  Meds  Problems  Med Hx  Surg Hx  Fam Hx      Objective  Well appearing patient in no apparent distress; mood and affect are within normal limits.  A full examination was performed including scalp, head, eyes, ears, nose, lips, neck, chest, axillae, abdomen, back, buttocks, bilateral upper extremities, bilateral lower extremities, hands, feet, fingers, toes, fingernails, and toenails. All findings within normal limits unless otherwise noted below.  Objective  Head to toe: Full body examination- no atypical moles or non mole skin cancer  Objective  Right Upper Back: Multiple scar- clear  Objective  Right Elbow - Posterior: Right elbow- scar- clear New round, pink lesion 3 week duration. Per patient it is improving.  No lymphadenopathy noted.  Images    Objective  Left Upper Back: Stuck-on, waxy, tan-brown papules and plaques. --Discussed benign etiology and prognosis.   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  The  documentation on 08/12/20 for the exam, diagnosis, procedures and orders are all accurate and complete.

## 2020-09-18 ENCOUNTER — Ambulatory Visit: Payer: Medicare PPO | Admitting: Physician Assistant

## 2020-09-18 DIAGNOSIS — S4991XA Unspecified injury of right shoulder and upper arm, initial encounter: Secondary | ICD-10-CM | POA: Diagnosis not present

## 2020-09-18 DIAGNOSIS — S42101A Fracture of unspecified part of scapula, right shoulder, initial encounter for closed fracture: Secondary | ICD-10-CM | POA: Diagnosis not present

## 2020-09-18 DIAGNOSIS — W19XXXA Unspecified fall, initial encounter: Secondary | ICD-10-CM | POA: Diagnosis not present

## 2020-09-23 ENCOUNTER — Ambulatory Visit
Admission: RE | Admit: 2020-09-23 | Discharge: 2020-09-23 | Disposition: A | Discharge status: Home / Self Care | Payer: Medicare PPO | Source: Ambulatory Visit | Attending: Orthopedic Surgery | Admitting: Orthopedic Surgery

## 2020-09-23 ENCOUNTER — Other Ambulatory Visit: Payer: Self-pay

## 2020-09-23 ENCOUNTER — Other Ambulatory Visit: Payer: Self-pay | Admitting: Orthopedic Surgery

## 2020-09-23 DIAGNOSIS — M25511 Pain in right shoulder: Secondary | ICD-10-CM

## 2020-09-23 DIAGNOSIS — S42114A Nondisplaced fracture of body of scapula, right shoulder, initial encounter for closed fracture: Secondary | ICD-10-CM | POA: Diagnosis not present

## 2020-09-23 DIAGNOSIS — S42101A Fracture of unspecified part of scapula, right shoulder, initial encounter for closed fracture: Secondary | ICD-10-CM | POA: Diagnosis not present

## 2020-09-23 DIAGNOSIS — Z9011 Acquired absence of right breast and nipple: Secondary | ICD-10-CM | POA: Diagnosis not present

## 2020-09-23 DIAGNOSIS — S42111A Displaced fracture of body of scapula, right shoulder, initial encounter for closed fracture: Secondary | ICD-10-CM | POA: Diagnosis not present

## 2020-09-23 DIAGNOSIS — M25512 Pain in left shoulder: Secondary | ICD-10-CM

## 2020-10-05 DIAGNOSIS — S42114D Nondisplaced fracture of body of scapula, right shoulder, subsequent encounter for fracture with routine healing: Secondary | ICD-10-CM | POA: Diagnosis not present

## 2020-11-02 DIAGNOSIS — S42114D Nondisplaced fracture of body of scapula, right shoulder, subsequent encounter for fracture with routine healing: Secondary | ICD-10-CM | POA: Diagnosis not present

## 2020-11-03 DIAGNOSIS — S42114D Nondisplaced fracture of body of scapula, right shoulder, subsequent encounter for fracture with routine healing: Secondary | ICD-10-CM | POA: Diagnosis not present

## 2020-11-03 DIAGNOSIS — M6281 Muscle weakness (generalized): Secondary | ICD-10-CM | POA: Diagnosis not present

## 2020-11-03 DIAGNOSIS — M25511 Pain in right shoulder: Secondary | ICD-10-CM | POA: Diagnosis not present

## 2020-11-03 DIAGNOSIS — M25611 Stiffness of right shoulder, not elsewhere classified: Secondary | ICD-10-CM | POA: Diagnosis not present

## 2020-11-09 DIAGNOSIS — M6281 Muscle weakness (generalized): Secondary | ICD-10-CM | POA: Diagnosis not present

## 2020-11-09 DIAGNOSIS — M25611 Stiffness of right shoulder, not elsewhere classified: Secondary | ICD-10-CM | POA: Diagnosis not present

## 2020-11-09 DIAGNOSIS — M25511 Pain in right shoulder: Secondary | ICD-10-CM | POA: Diagnosis not present

## 2020-11-09 DIAGNOSIS — S42114D Nondisplaced fracture of body of scapula, right shoulder, subsequent encounter for fracture with routine healing: Secondary | ICD-10-CM | POA: Diagnosis not present

## 2020-11-12 DIAGNOSIS — M25511 Pain in right shoulder: Secondary | ICD-10-CM | POA: Diagnosis not present

## 2020-11-12 DIAGNOSIS — M25611 Stiffness of right shoulder, not elsewhere classified: Secondary | ICD-10-CM | POA: Diagnosis not present

## 2020-11-12 DIAGNOSIS — R262 Difficulty in walking, not elsewhere classified: Secondary | ICD-10-CM | POA: Diagnosis not present

## 2020-11-12 DIAGNOSIS — M6281 Muscle weakness (generalized): Secondary | ICD-10-CM | POA: Diagnosis not present

## 2020-11-16 DIAGNOSIS — S42114D Nondisplaced fracture of body of scapula, right shoulder, subsequent encounter for fracture with routine healing: Secondary | ICD-10-CM | POA: Diagnosis not present

## 2020-11-16 DIAGNOSIS — M6281 Muscle weakness (generalized): Secondary | ICD-10-CM | POA: Diagnosis not present

## 2020-11-16 DIAGNOSIS — M25611 Stiffness of right shoulder, not elsewhere classified: Secondary | ICD-10-CM | POA: Diagnosis not present

## 2020-11-16 DIAGNOSIS — M25511 Pain in right shoulder: Secondary | ICD-10-CM | POA: Diagnosis not present

## 2020-11-19 DIAGNOSIS — M6281 Muscle weakness (generalized): Secondary | ICD-10-CM | POA: Diagnosis not present

## 2020-11-19 DIAGNOSIS — M25611 Stiffness of right shoulder, not elsewhere classified: Secondary | ICD-10-CM | POA: Diagnosis not present

## 2020-11-19 DIAGNOSIS — M25511 Pain in right shoulder: Secondary | ICD-10-CM | POA: Diagnosis not present

## 2020-11-19 DIAGNOSIS — S42114D Nondisplaced fracture of body of scapula, right shoulder, subsequent encounter for fracture with routine healing: Secondary | ICD-10-CM | POA: Diagnosis not present

## 2020-11-23 DIAGNOSIS — M25611 Stiffness of right shoulder, not elsewhere classified: Secondary | ICD-10-CM | POA: Diagnosis not present

## 2020-11-23 DIAGNOSIS — S42114D Nondisplaced fracture of body of scapula, right shoulder, subsequent encounter for fracture with routine healing: Secondary | ICD-10-CM | POA: Diagnosis not present

## 2020-11-23 DIAGNOSIS — M6281 Muscle weakness (generalized): Secondary | ICD-10-CM | POA: Diagnosis not present

## 2020-11-23 DIAGNOSIS — M25511 Pain in right shoulder: Secondary | ICD-10-CM | POA: Diagnosis not present

## 2020-11-27 DIAGNOSIS — M6281 Muscle weakness (generalized): Secondary | ICD-10-CM | POA: Diagnosis not present

## 2020-11-27 DIAGNOSIS — S42114D Nondisplaced fracture of body of scapula, right shoulder, subsequent encounter for fracture with routine healing: Secondary | ICD-10-CM | POA: Diagnosis not present

## 2020-11-27 DIAGNOSIS — M25511 Pain in right shoulder: Secondary | ICD-10-CM | POA: Diagnosis not present

## 2020-11-27 DIAGNOSIS — M25611 Stiffness of right shoulder, not elsewhere classified: Secondary | ICD-10-CM | POA: Diagnosis not present

## 2020-11-30 DIAGNOSIS — S42101A Fracture of unspecified part of scapula, right shoulder, initial encounter for closed fracture: Secondary | ICD-10-CM | POA: Diagnosis not present

## 2020-12-01 DIAGNOSIS — S42114D Nondisplaced fracture of body of scapula, right shoulder, subsequent encounter for fracture with routine healing: Secondary | ICD-10-CM | POA: Diagnosis not present

## 2020-12-01 DIAGNOSIS — M25511 Pain in right shoulder: Secondary | ICD-10-CM | POA: Diagnosis not present

## 2020-12-01 DIAGNOSIS — M6281 Muscle weakness (generalized): Secondary | ICD-10-CM | POA: Diagnosis not present

## 2020-12-01 DIAGNOSIS — M25611 Stiffness of right shoulder, not elsewhere classified: Secondary | ICD-10-CM | POA: Diagnosis not present

## 2020-12-04 DIAGNOSIS — M25611 Stiffness of right shoulder, not elsewhere classified: Secondary | ICD-10-CM | POA: Diagnosis not present

## 2020-12-04 DIAGNOSIS — S42114D Nondisplaced fracture of body of scapula, right shoulder, subsequent encounter for fracture with routine healing: Secondary | ICD-10-CM | POA: Diagnosis not present

## 2020-12-04 DIAGNOSIS — M6281 Muscle weakness (generalized): Secondary | ICD-10-CM | POA: Diagnosis not present

## 2020-12-04 DIAGNOSIS — M25511 Pain in right shoulder: Secondary | ICD-10-CM | POA: Diagnosis not present

## 2020-12-07 DIAGNOSIS — M25511 Pain in right shoulder: Secondary | ICD-10-CM | POA: Diagnosis not present

## 2020-12-07 DIAGNOSIS — S42114D Nondisplaced fracture of body of scapula, right shoulder, subsequent encounter for fracture with routine healing: Secondary | ICD-10-CM | POA: Diagnosis not present

## 2020-12-07 DIAGNOSIS — M6281 Muscle weakness (generalized): Secondary | ICD-10-CM | POA: Diagnosis not present

## 2020-12-07 DIAGNOSIS — M25611 Stiffness of right shoulder, not elsewhere classified: Secondary | ICD-10-CM | POA: Diagnosis not present

## 2020-12-10 DIAGNOSIS — M6281 Muscle weakness (generalized): Secondary | ICD-10-CM | POA: Diagnosis not present

## 2020-12-10 DIAGNOSIS — M25511 Pain in right shoulder: Secondary | ICD-10-CM | POA: Diagnosis not present

## 2020-12-10 DIAGNOSIS — S42114D Nondisplaced fracture of body of scapula, right shoulder, subsequent encounter for fracture with routine healing: Secondary | ICD-10-CM | POA: Diagnosis not present

## 2020-12-10 DIAGNOSIS — M25611 Stiffness of right shoulder, not elsewhere classified: Secondary | ICD-10-CM | POA: Diagnosis not present

## 2020-12-14 DIAGNOSIS — M6281 Muscle weakness (generalized): Secondary | ICD-10-CM | POA: Diagnosis not present

## 2020-12-14 DIAGNOSIS — M25611 Stiffness of right shoulder, not elsewhere classified: Secondary | ICD-10-CM | POA: Diagnosis not present

## 2020-12-14 DIAGNOSIS — S42114D Nondisplaced fracture of body of scapula, right shoulder, subsequent encounter for fracture with routine healing: Secondary | ICD-10-CM | POA: Diagnosis not present

## 2020-12-14 DIAGNOSIS — M25511 Pain in right shoulder: Secondary | ICD-10-CM | POA: Diagnosis not present

## 2020-12-15 ENCOUNTER — Ambulatory Visit: Payer: Medicare PPO | Admitting: Physician Assistant

## 2020-12-17 DIAGNOSIS — S42114D Nondisplaced fracture of body of scapula, right shoulder, subsequent encounter for fracture with routine healing: Secondary | ICD-10-CM | POA: Diagnosis not present

## 2020-12-17 DIAGNOSIS — M6281 Muscle weakness (generalized): Secondary | ICD-10-CM | POA: Diagnosis not present

## 2020-12-17 DIAGNOSIS — M25511 Pain in right shoulder: Secondary | ICD-10-CM | POA: Diagnosis not present

## 2020-12-17 DIAGNOSIS — M25611 Stiffness of right shoulder, not elsewhere classified: Secondary | ICD-10-CM | POA: Diagnosis not present

## 2020-12-21 DIAGNOSIS — M25611 Stiffness of right shoulder, not elsewhere classified: Secondary | ICD-10-CM | POA: Diagnosis not present

## 2020-12-21 DIAGNOSIS — S42114D Nondisplaced fracture of body of scapula, right shoulder, subsequent encounter for fracture with routine healing: Secondary | ICD-10-CM | POA: Diagnosis not present

## 2020-12-21 DIAGNOSIS — M6281 Muscle weakness (generalized): Secondary | ICD-10-CM | POA: Diagnosis not present

## 2020-12-21 DIAGNOSIS — M25511 Pain in right shoulder: Secondary | ICD-10-CM | POA: Diagnosis not present

## 2020-12-24 DIAGNOSIS — M25611 Stiffness of right shoulder, not elsewhere classified: Secondary | ICD-10-CM | POA: Diagnosis not present

## 2020-12-24 DIAGNOSIS — S42114D Nondisplaced fracture of body of scapula, right shoulder, subsequent encounter for fracture with routine healing: Secondary | ICD-10-CM | POA: Diagnosis not present

## 2020-12-24 DIAGNOSIS — M25672 Stiffness of left ankle, not elsewhere classified: Secondary | ICD-10-CM | POA: Diagnosis not present

## 2020-12-24 DIAGNOSIS — M6281 Muscle weakness (generalized): Secondary | ICD-10-CM | POA: Diagnosis not present

## 2020-12-28 DIAGNOSIS — M6281 Muscle weakness (generalized): Secondary | ICD-10-CM | POA: Diagnosis not present

## 2020-12-28 DIAGNOSIS — M25611 Stiffness of right shoulder, not elsewhere classified: Secondary | ICD-10-CM | POA: Diagnosis not present

## 2020-12-28 DIAGNOSIS — M25511 Pain in right shoulder: Secondary | ICD-10-CM | POA: Diagnosis not present

## 2020-12-28 DIAGNOSIS — S42114D Nondisplaced fracture of body of scapula, right shoulder, subsequent encounter for fracture with routine healing: Secondary | ICD-10-CM | POA: Diagnosis not present

## 2020-12-30 ENCOUNTER — Other Ambulatory Visit: Payer: Self-pay | Admitting: Family Medicine

## 2020-12-30 DIAGNOSIS — Z1231 Encounter for screening mammogram for malignant neoplasm of breast: Secondary | ICD-10-CM

## 2020-12-31 DIAGNOSIS — S42114D Nondisplaced fracture of body of scapula, right shoulder, subsequent encounter for fracture with routine healing: Secondary | ICD-10-CM | POA: Diagnosis not present

## 2020-12-31 DIAGNOSIS — M6281 Muscle weakness (generalized): Secondary | ICD-10-CM | POA: Diagnosis not present

## 2020-12-31 DIAGNOSIS — M25611 Stiffness of right shoulder, not elsewhere classified: Secondary | ICD-10-CM | POA: Diagnosis not present

## 2020-12-31 DIAGNOSIS — M25511 Pain in right shoulder: Secondary | ICD-10-CM | POA: Diagnosis not present

## 2021-01-04 DIAGNOSIS — M25611 Stiffness of right shoulder, not elsewhere classified: Secondary | ICD-10-CM | POA: Diagnosis not present

## 2021-01-04 DIAGNOSIS — M25511 Pain in right shoulder: Secondary | ICD-10-CM | POA: Diagnosis not present

## 2021-01-04 DIAGNOSIS — M6281 Muscle weakness (generalized): Secondary | ICD-10-CM | POA: Diagnosis not present

## 2021-01-04 DIAGNOSIS — S42114D Nondisplaced fracture of body of scapula, right shoulder, subsequent encounter for fracture with routine healing: Secondary | ICD-10-CM | POA: Diagnosis not present

## 2021-01-07 DIAGNOSIS — M25511 Pain in right shoulder: Secondary | ICD-10-CM | POA: Diagnosis not present

## 2021-01-07 DIAGNOSIS — M6281 Muscle weakness (generalized): Secondary | ICD-10-CM | POA: Diagnosis not present

## 2021-01-07 DIAGNOSIS — S42114D Nondisplaced fracture of body of scapula, right shoulder, subsequent encounter for fracture with routine healing: Secondary | ICD-10-CM | POA: Diagnosis not present

## 2021-01-07 DIAGNOSIS — M25611 Stiffness of right shoulder, not elsewhere classified: Secondary | ICD-10-CM | POA: Diagnosis not present

## 2021-01-11 DIAGNOSIS — S42114A Nondisplaced fracture of body of scapula, right shoulder, initial encounter for closed fracture: Secondary | ICD-10-CM | POA: Diagnosis not present

## 2021-01-12 DIAGNOSIS — M6281 Muscle weakness (generalized): Secondary | ICD-10-CM | POA: Diagnosis not present

## 2021-01-12 DIAGNOSIS — M25611 Stiffness of right shoulder, not elsewhere classified: Secondary | ICD-10-CM | POA: Diagnosis not present

## 2021-01-12 DIAGNOSIS — S42114D Nondisplaced fracture of body of scapula, right shoulder, subsequent encounter for fracture with routine healing: Secondary | ICD-10-CM | POA: Diagnosis not present

## 2021-01-15 DIAGNOSIS — M25511 Pain in right shoulder: Secondary | ICD-10-CM | POA: Diagnosis not present

## 2021-01-15 DIAGNOSIS — M25611 Stiffness of right shoulder, not elsewhere classified: Secondary | ICD-10-CM | POA: Diagnosis not present

## 2021-01-15 DIAGNOSIS — M6281 Muscle weakness (generalized): Secondary | ICD-10-CM | POA: Diagnosis not present

## 2021-01-15 DIAGNOSIS — S42114D Nondisplaced fracture of body of scapula, right shoulder, subsequent encounter for fracture with routine healing: Secondary | ICD-10-CM | POA: Diagnosis not present

## 2021-01-18 DIAGNOSIS — M25611 Stiffness of right shoulder, not elsewhere classified: Secondary | ICD-10-CM | POA: Diagnosis not present

## 2021-01-18 DIAGNOSIS — M25511 Pain in right shoulder: Secondary | ICD-10-CM | POA: Diagnosis not present

## 2021-01-18 DIAGNOSIS — M6281 Muscle weakness (generalized): Secondary | ICD-10-CM | POA: Diagnosis not present

## 2021-01-18 DIAGNOSIS — S42114D Nondisplaced fracture of body of scapula, right shoulder, subsequent encounter for fracture with routine healing: Secondary | ICD-10-CM | POA: Diagnosis not present

## 2021-01-21 DIAGNOSIS — S42114D Nondisplaced fracture of body of scapula, right shoulder, subsequent encounter for fracture with routine healing: Secondary | ICD-10-CM | POA: Diagnosis not present

## 2021-01-21 DIAGNOSIS — M25511 Pain in right shoulder: Secondary | ICD-10-CM | POA: Diagnosis not present

## 2021-01-21 DIAGNOSIS — M6281 Muscle weakness (generalized): Secondary | ICD-10-CM | POA: Diagnosis not present

## 2021-01-21 DIAGNOSIS — M25611 Stiffness of right shoulder, not elsewhere classified: Secondary | ICD-10-CM | POA: Diagnosis not present

## 2021-01-26 DIAGNOSIS — M25511 Pain in right shoulder: Secondary | ICD-10-CM | POA: Diagnosis not present

## 2021-01-26 DIAGNOSIS — M6281 Muscle weakness (generalized): Secondary | ICD-10-CM | POA: Diagnosis not present

## 2021-01-26 DIAGNOSIS — M25611 Stiffness of right shoulder, not elsewhere classified: Secondary | ICD-10-CM | POA: Diagnosis not present

## 2021-01-26 DIAGNOSIS — S42114D Nondisplaced fracture of body of scapula, right shoulder, subsequent encounter for fracture with routine healing: Secondary | ICD-10-CM | POA: Diagnosis not present

## 2021-02-03 DIAGNOSIS — H349 Unspecified retinal vascular occlusion: Secondary | ICD-10-CM | POA: Diagnosis not present

## 2021-02-03 DIAGNOSIS — H524 Presbyopia: Secondary | ICD-10-CM | POA: Diagnosis not present

## 2021-02-03 DIAGNOSIS — Z961 Presence of intraocular lens: Secondary | ICD-10-CM | POA: Diagnosis not present

## 2021-02-03 DIAGNOSIS — H35373 Puckering of macula, bilateral: Secondary | ICD-10-CM | POA: Diagnosis not present

## 2021-02-08 ENCOUNTER — Other Ambulatory Visit (HOSPITAL_COMMUNITY): Payer: Self-pay | Admitting: Ophthalmology

## 2021-02-08 DIAGNOSIS — H34231 Retinal artery branch occlusion, right eye: Secondary | ICD-10-CM

## 2021-02-09 ENCOUNTER — Other Ambulatory Visit: Payer: Self-pay

## 2021-02-09 ENCOUNTER — Ambulatory Visit (HOSPITAL_COMMUNITY)
Admission: RE | Admit: 2021-02-09 | Discharge: 2021-02-09 | Disposition: A | Discharge status: Home / Self Care | Payer: Medicare PPO | Source: Ambulatory Visit | Attending: Internal Medicine | Admitting: Internal Medicine

## 2021-02-09 DIAGNOSIS — H34231 Retinal artery branch occlusion, right eye: Secondary | ICD-10-CM | POA: Diagnosis not present

## 2021-02-18 ENCOUNTER — Other Ambulatory Visit: Payer: Self-pay

## 2021-02-18 ENCOUNTER — Ambulatory Visit
Admission: RE | Admit: 2021-02-18 | Discharge: 2021-02-18 | Disposition: A | Discharge status: Home / Self Care | Payer: Medicare PPO | Source: Ambulatory Visit | Attending: Family Medicine | Admitting: Family Medicine

## 2021-02-18 DIAGNOSIS — Z1231 Encounter for screening mammogram for malignant neoplasm of breast: Secondary | ICD-10-CM | POA: Diagnosis not present

## 2021-04-19 DIAGNOSIS — S42114A Nondisplaced fracture of body of scapula, right shoulder, initial encounter for closed fracture: Secondary | ICD-10-CM | POA: Diagnosis not present

## 2021-06-25 DIAGNOSIS — Z13220 Encounter for screening for lipoid disorders: Secondary | ICD-10-CM | POA: Diagnosis not present

## 2021-06-25 DIAGNOSIS — Z136 Encounter for screening for cardiovascular disorders: Secondary | ICD-10-CM | POA: Diagnosis not present

## 2021-06-25 DIAGNOSIS — E559 Vitamin D deficiency, unspecified: Secondary | ICD-10-CM | POA: Diagnosis not present

## 2021-06-25 DIAGNOSIS — D508 Other iron deficiency anemias: Secondary | ICD-10-CM | POA: Diagnosis not present

## 2021-07-06 DIAGNOSIS — Z0000 Encounter for general adult medical examination without abnormal findings: Secondary | ICD-10-CM | POA: Diagnosis not present

## 2021-07-06 DIAGNOSIS — Z853 Personal history of malignant neoplasm of breast: Secondary | ICD-10-CM | POA: Diagnosis not present

## 2021-07-06 DIAGNOSIS — E559 Vitamin D deficiency, unspecified: Secondary | ICD-10-CM | POA: Diagnosis not present

## 2021-07-06 DIAGNOSIS — N182 Chronic kidney disease, stage 2 (mild): Secondary | ICD-10-CM | POA: Diagnosis not present

## 2021-07-06 DIAGNOSIS — M810 Age-related osteoporosis without current pathological fracture: Secondary | ICD-10-CM | POA: Diagnosis not present

## 2021-07-06 DIAGNOSIS — K219 Gastro-esophageal reflux disease without esophagitis: Secondary | ICD-10-CM | POA: Diagnosis not present

## 2021-07-06 DIAGNOSIS — M79645 Pain in left finger(s): Secondary | ICD-10-CM | POA: Diagnosis not present

## 2021-07-21 DIAGNOSIS — M65312 Trigger thumb, left thumb: Secondary | ICD-10-CM | POA: Diagnosis not present

## 2021-07-30 DIAGNOSIS — S82091A Other fracture of right patella, initial encounter for closed fracture: Secondary | ICD-10-CM | POA: Diagnosis not present

## 2021-07-30 DIAGNOSIS — S0993XA Unspecified injury of face, initial encounter: Secondary | ICD-10-CM | POA: Diagnosis not present

## 2021-07-30 DIAGNOSIS — Z7189 Other specified counseling: Secondary | ICD-10-CM | POA: Diagnosis not present

## 2021-08-04 DIAGNOSIS — S82001A Unspecified fracture of right patella, initial encounter for closed fracture: Secondary | ICD-10-CM | POA: Diagnosis not present

## 2021-08-16 DIAGNOSIS — S42114D Nondisplaced fracture of body of scapula, right shoulder, subsequent encounter for fracture with routine healing: Secondary | ICD-10-CM | POA: Diagnosis not present

## 2021-09-13 DIAGNOSIS — S42114D Nondisplaced fracture of body of scapula, right shoulder, subsequent encounter for fracture with routine healing: Secondary | ICD-10-CM | POA: Diagnosis not present

## 2021-09-17 DIAGNOSIS — R262 Difficulty in walking, not elsewhere classified: Secondary | ICD-10-CM | POA: Diagnosis not present

## 2021-09-17 DIAGNOSIS — M6281 Muscle weakness (generalized): Secondary | ICD-10-CM | POA: Diagnosis not present

## 2021-09-17 DIAGNOSIS — M25661 Stiffness of right knee, not elsewhere classified: Secondary | ICD-10-CM | POA: Diagnosis not present

## 2021-09-17 DIAGNOSIS — M25561 Pain in right knee: Secondary | ICD-10-CM | POA: Diagnosis not present

## 2021-09-17 DIAGNOSIS — S82034D Nondisplaced transverse fracture of right patella, subsequent encounter for closed fracture with routine healing: Secondary | ICD-10-CM | POA: Diagnosis not present

## 2021-09-22 DIAGNOSIS — M25661 Stiffness of right knee, not elsewhere classified: Secondary | ICD-10-CM | POA: Diagnosis not present

## 2021-09-22 DIAGNOSIS — M6281 Muscle weakness (generalized): Secondary | ICD-10-CM | POA: Diagnosis not present

## 2021-09-22 DIAGNOSIS — R262 Difficulty in walking, not elsewhere classified: Secondary | ICD-10-CM | POA: Diagnosis not present

## 2021-09-22 DIAGNOSIS — M25561 Pain in right knee: Secondary | ICD-10-CM | POA: Diagnosis not present

## 2021-09-22 DIAGNOSIS — S82034D Nondisplaced transverse fracture of right patella, subsequent encounter for closed fracture with routine healing: Secondary | ICD-10-CM | POA: Diagnosis not present

## 2021-09-24 DIAGNOSIS — M25661 Stiffness of right knee, not elsewhere classified: Secondary | ICD-10-CM | POA: Diagnosis not present

## 2021-09-24 DIAGNOSIS — M25561 Pain in right knee: Secondary | ICD-10-CM | POA: Diagnosis not present

## 2021-09-24 DIAGNOSIS — S82034D Nondisplaced transverse fracture of right patella, subsequent encounter for closed fracture with routine healing: Secondary | ICD-10-CM | POA: Diagnosis not present

## 2021-09-24 DIAGNOSIS — M6281 Muscle weakness (generalized): Secondary | ICD-10-CM | POA: Diagnosis not present

## 2021-09-24 DIAGNOSIS — R262 Difficulty in walking, not elsewhere classified: Secondary | ICD-10-CM | POA: Diagnosis not present

## 2021-09-27 DIAGNOSIS — M25661 Stiffness of right knee, not elsewhere classified: Secondary | ICD-10-CM | POA: Diagnosis not present

## 2021-09-27 DIAGNOSIS — S82034D Nondisplaced transverse fracture of right patella, subsequent encounter for closed fracture with routine healing: Secondary | ICD-10-CM | POA: Diagnosis not present

## 2021-09-27 DIAGNOSIS — R262 Difficulty in walking, not elsewhere classified: Secondary | ICD-10-CM | POA: Diagnosis not present

## 2021-09-27 DIAGNOSIS — M25561 Pain in right knee: Secondary | ICD-10-CM | POA: Diagnosis not present

## 2021-09-27 DIAGNOSIS — M6281 Muscle weakness (generalized): Secondary | ICD-10-CM | POA: Diagnosis not present

## 2021-09-29 DIAGNOSIS — R262 Difficulty in walking, not elsewhere classified: Secondary | ICD-10-CM | POA: Diagnosis not present

## 2021-09-29 DIAGNOSIS — M6281 Muscle weakness (generalized): Secondary | ICD-10-CM | POA: Diagnosis not present

## 2021-09-29 DIAGNOSIS — S82034D Nondisplaced transverse fracture of right patella, subsequent encounter for closed fracture with routine healing: Secondary | ICD-10-CM | POA: Diagnosis not present

## 2021-09-29 DIAGNOSIS — M25661 Stiffness of right knee, not elsewhere classified: Secondary | ICD-10-CM | POA: Diagnosis not present

## 2021-09-29 DIAGNOSIS — M25561 Pain in right knee: Secondary | ICD-10-CM | POA: Diagnosis not present

## 2021-10-04 DIAGNOSIS — R262 Difficulty in walking, not elsewhere classified: Secondary | ICD-10-CM | POA: Diagnosis not present

## 2021-10-04 DIAGNOSIS — S82034D Nondisplaced transverse fracture of right patella, subsequent encounter for closed fracture with routine healing: Secondary | ICD-10-CM | POA: Diagnosis not present

## 2021-10-04 DIAGNOSIS — M25561 Pain in right knee: Secondary | ICD-10-CM | POA: Diagnosis not present

## 2021-10-04 DIAGNOSIS — M25661 Stiffness of right knee, not elsewhere classified: Secondary | ICD-10-CM | POA: Diagnosis not present

## 2021-10-04 DIAGNOSIS — M6281 Muscle weakness (generalized): Secondary | ICD-10-CM | POA: Diagnosis not present

## 2021-10-05 DIAGNOSIS — L608 Other nail disorders: Secondary | ICD-10-CM | POA: Diagnosis not present

## 2021-10-05 DIAGNOSIS — R899 Unspecified abnormal finding in specimens from other organs, systems and tissues: Secondary | ICD-10-CM | POA: Diagnosis not present

## 2021-10-06 DIAGNOSIS — R262 Difficulty in walking, not elsewhere classified: Secondary | ICD-10-CM | POA: Diagnosis not present

## 2021-10-06 DIAGNOSIS — S82034D Nondisplaced transverse fracture of right patella, subsequent encounter for closed fracture with routine healing: Secondary | ICD-10-CM | POA: Diagnosis not present

## 2021-10-06 DIAGNOSIS — M25661 Stiffness of right knee, not elsewhere classified: Secondary | ICD-10-CM | POA: Diagnosis not present

## 2021-10-06 DIAGNOSIS — M6281 Muscle weakness (generalized): Secondary | ICD-10-CM | POA: Diagnosis not present

## 2021-10-06 DIAGNOSIS — M25561 Pain in right knee: Secondary | ICD-10-CM | POA: Diagnosis not present

## 2021-10-11 DIAGNOSIS — S82001A Unspecified fracture of right patella, initial encounter for closed fracture: Secondary | ICD-10-CM | POA: Diagnosis not present

## 2021-10-12 DIAGNOSIS — M25661 Stiffness of right knee, not elsewhere classified: Secondary | ICD-10-CM | POA: Diagnosis not present

## 2021-10-12 DIAGNOSIS — M25561 Pain in right knee: Secondary | ICD-10-CM | POA: Diagnosis not present

## 2021-10-12 DIAGNOSIS — M6281 Muscle weakness (generalized): Secondary | ICD-10-CM | POA: Diagnosis not present

## 2021-10-12 DIAGNOSIS — R262 Difficulty in walking, not elsewhere classified: Secondary | ICD-10-CM | POA: Diagnosis not present

## 2021-10-12 DIAGNOSIS — S82034D Nondisplaced transverse fracture of right patella, subsequent encounter for closed fracture with routine healing: Secondary | ICD-10-CM | POA: Diagnosis not present

## 2021-10-13 ENCOUNTER — Other Ambulatory Visit: Payer: Self-pay | Admitting: Orthopedic Surgery

## 2021-10-13 DIAGNOSIS — M19041 Primary osteoarthritis, right hand: Secondary | ICD-10-CM | POA: Diagnosis not present

## 2021-10-13 DIAGNOSIS — M79644 Pain in right finger(s): Secondary | ICD-10-CM | POA: Diagnosis not present

## 2021-10-13 DIAGNOSIS — R2231 Localized swelling, mass and lump, right upper limb: Secondary | ICD-10-CM | POA: Diagnosis not present

## 2021-10-14 DIAGNOSIS — M25661 Stiffness of right knee, not elsewhere classified: Secondary | ICD-10-CM | POA: Diagnosis not present

## 2021-10-14 DIAGNOSIS — M25561 Pain in right knee: Secondary | ICD-10-CM | POA: Diagnosis not present

## 2021-10-14 DIAGNOSIS — S82034D Nondisplaced transverse fracture of right patella, subsequent encounter for closed fracture with routine healing: Secondary | ICD-10-CM | POA: Diagnosis not present

## 2021-10-14 DIAGNOSIS — R262 Difficulty in walking, not elsewhere classified: Secondary | ICD-10-CM | POA: Diagnosis not present

## 2021-10-14 DIAGNOSIS — M6281 Muscle weakness (generalized): Secondary | ICD-10-CM | POA: Diagnosis not present

## 2021-10-18 DIAGNOSIS — S82034D Nondisplaced transverse fracture of right patella, subsequent encounter for closed fracture with routine healing: Secondary | ICD-10-CM | POA: Diagnosis not present

## 2021-10-18 DIAGNOSIS — R262 Difficulty in walking, not elsewhere classified: Secondary | ICD-10-CM | POA: Diagnosis not present

## 2021-10-18 DIAGNOSIS — M6281 Muscle weakness (generalized): Secondary | ICD-10-CM | POA: Diagnosis not present

## 2021-10-18 DIAGNOSIS — M25561 Pain in right knee: Secondary | ICD-10-CM | POA: Diagnosis not present

## 2021-10-18 DIAGNOSIS — M25661 Stiffness of right knee, not elsewhere classified: Secondary | ICD-10-CM | POA: Diagnosis not present

## 2021-10-20 DIAGNOSIS — M25561 Pain in right knee: Secondary | ICD-10-CM | POA: Diagnosis not present

## 2021-10-20 DIAGNOSIS — R262 Difficulty in walking, not elsewhere classified: Secondary | ICD-10-CM | POA: Diagnosis not present

## 2021-10-20 DIAGNOSIS — S82034D Nondisplaced transverse fracture of right patella, subsequent encounter for closed fracture with routine healing: Secondary | ICD-10-CM | POA: Diagnosis not present

## 2021-10-20 DIAGNOSIS — M6281 Muscle weakness (generalized): Secondary | ICD-10-CM | POA: Diagnosis not present

## 2021-10-20 DIAGNOSIS — M25661 Stiffness of right knee, not elsewhere classified: Secondary | ICD-10-CM | POA: Diagnosis not present

## 2021-10-25 DIAGNOSIS — M25561 Pain in right knee: Secondary | ICD-10-CM | POA: Diagnosis not present

## 2021-10-25 DIAGNOSIS — M25661 Stiffness of right knee, not elsewhere classified: Secondary | ICD-10-CM | POA: Diagnosis not present

## 2021-10-25 DIAGNOSIS — M6281 Muscle weakness (generalized): Secondary | ICD-10-CM | POA: Diagnosis not present

## 2021-10-25 DIAGNOSIS — R262 Difficulty in walking, not elsewhere classified: Secondary | ICD-10-CM | POA: Diagnosis not present

## 2021-10-25 DIAGNOSIS — S82034D Nondisplaced transverse fracture of right patella, subsequent encounter for closed fracture with routine healing: Secondary | ICD-10-CM | POA: Diagnosis not present

## 2021-10-27 ENCOUNTER — Ambulatory Visit: Payer: Medicare PPO | Admitting: Physician Assistant

## 2021-10-27 DIAGNOSIS — R262 Difficulty in walking, not elsewhere classified: Secondary | ICD-10-CM | POA: Diagnosis not present

## 2021-10-27 DIAGNOSIS — M6281 Muscle weakness (generalized): Secondary | ICD-10-CM | POA: Diagnosis not present

## 2021-10-27 DIAGNOSIS — M25661 Stiffness of right knee, not elsewhere classified: Secondary | ICD-10-CM | POA: Diagnosis not present

## 2021-10-27 DIAGNOSIS — M25561 Pain in right knee: Secondary | ICD-10-CM | POA: Diagnosis not present

## 2021-10-27 DIAGNOSIS — S82034D Nondisplaced transverse fracture of right patella, subsequent encounter for closed fracture with routine healing: Secondary | ICD-10-CM | POA: Diagnosis not present

## 2021-11-05 ENCOUNTER — Encounter (HOSPITAL_BASED_OUTPATIENT_CLINIC_OR_DEPARTMENT_OTHER): Payer: Self-pay

## 2021-11-05 ENCOUNTER — Ambulatory Visit (HOSPITAL_BASED_OUTPATIENT_CLINIC_OR_DEPARTMENT_OTHER): Admit: 2021-11-05 | Payer: Medicare PPO | Admitting: Orthopedic Surgery

## 2021-11-05 SURGERY — EXCISION METACARPAL MASS
Anesthesia: Choice | Laterality: Right

## 2022-01-18 ENCOUNTER — Other Ambulatory Visit: Payer: Self-pay | Admitting: Family Medicine

## 2022-01-18 DIAGNOSIS — Z1231 Encounter for screening mammogram for malignant neoplasm of breast: Secondary | ICD-10-CM

## 2022-01-25 ENCOUNTER — Encounter: Payer: Self-pay | Admitting: Physician Assistant

## 2022-01-25 ENCOUNTER — Ambulatory Visit: Payer: Medicare PPO | Admitting: Physician Assistant

## 2022-01-25 DIAGNOSIS — Z85820 Personal history of malignant melanoma of skin: Secondary | ICD-10-CM

## 2022-01-25 DIAGNOSIS — Z85828 Personal history of other malignant neoplasm of skin: Secondary | ICD-10-CM

## 2022-01-25 DIAGNOSIS — Z1283 Encounter for screening for malignant neoplasm of skin: Secondary | ICD-10-CM | POA: Diagnosis not present

## 2022-01-26 ENCOUNTER — Encounter: Payer: Self-pay | Admitting: Physician Assistant

## 2022-01-26 NOTE — Progress Notes (Signed)
   Follow-Up Visit   Subjective  Jeanette Gonzalez is a 76 y.o. female who presents for the following: Annual Exam (Full body skin check, right pinky finger bump on the tip with changes in the nail bed x 5 months hand specialist stated surgery needed. Personal history of bcc and MM level 3 in 2004).   The following portions of the chart were reviewed this encounter and updated as appropriate:  Tobacco  Allergies  Meds  Problems  Med Hx  Surg Hx  Fam Hx      Objective  Well appearing patient in no apparent distress; mood and affect are within normal limits.  A full examination was performed including scalp, head, eyes, ears, nose, lips, neck, chest, axillae, abdomen, back, buttocks, bilateral upper extremities, bilateral lower extremities, hands, feet, fingers, toes, fingernails, and toenails. All findings within normal limits unless otherwise noted below.  Right Elbow - Posterior No atypical nevi or signs of NMSC noted at the time of the visit. Level 3 MM 2004 Dr. Evorn Gong Scar is clear today.   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  The documentation on 01/26/22 for the exam, diagnosis, procedures and orders are all accurate and complete.

## 2022-02-08 ENCOUNTER — Ambulatory Visit: Payer: Medicare PPO | Admitting: Physician Assistant

## 2022-02-08 DIAGNOSIS — Z961 Presence of intraocular lens: Secondary | ICD-10-CM | POA: Diagnosis not present

## 2022-02-08 DIAGNOSIS — H35373 Puckering of macula, bilateral: Secondary | ICD-10-CM | POA: Diagnosis not present

## 2022-02-08 DIAGNOSIS — H5213 Myopia, bilateral: Secondary | ICD-10-CM | POA: Diagnosis not present

## 2022-03-09 ENCOUNTER — Ambulatory Visit
Admission: RE | Admit: 2022-03-09 | Discharge: 2022-03-09 | Disposition: A | Discharge status: Home / Self Care | Payer: Medicare PPO | Source: Ambulatory Visit | Attending: Family Medicine | Admitting: Family Medicine

## 2022-03-09 DIAGNOSIS — Z1231 Encounter for screening mammogram for malignant neoplasm of breast: Secondary | ICD-10-CM | POA: Diagnosis not present

## 2022-05-23 IMAGING — CT CT SHOULDER*R* W/O CM
2 series · 10 of 14 positions shown, 12 images · non-contrast
Comparison: Right shoulder x-rays dated September 18, 2020.

CLINICAL DATA: Right scapular fracture after fall 1 week ago.

EXAM:
CT OF THE UPPER RIGHT EXTREMITY WITHOUT CONTRAST
TECHNIQUE: Multidetector CT imaging of the right shoulder was performed
according to the standard protocol.

[Series 3: soft tissue · axial · 0.50mm/px · z∈[-185,-63]mm · 5 of 93 slices shown]
[im 16/93  soft-tissue]
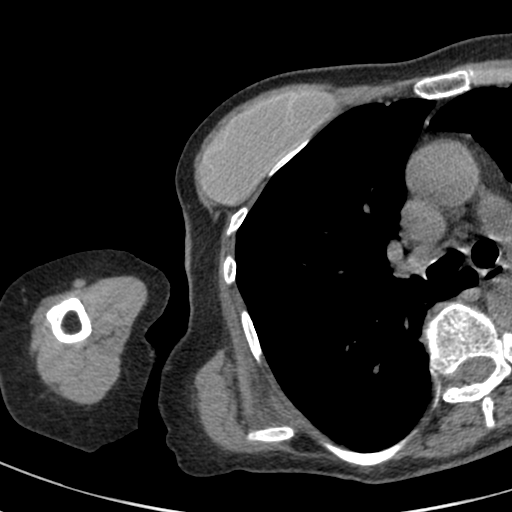
[im 31/93  soft-tissue]
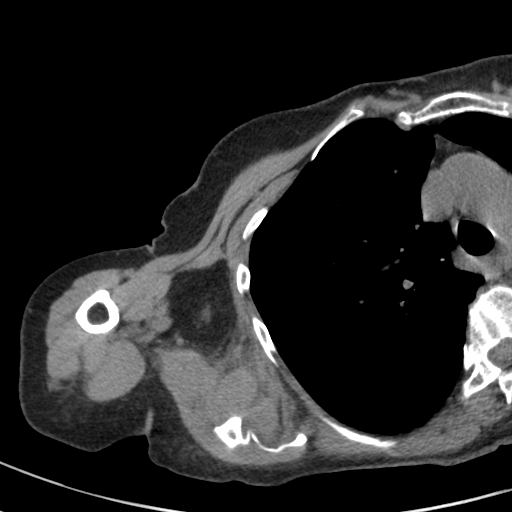
[im 47/93  soft-tissue]
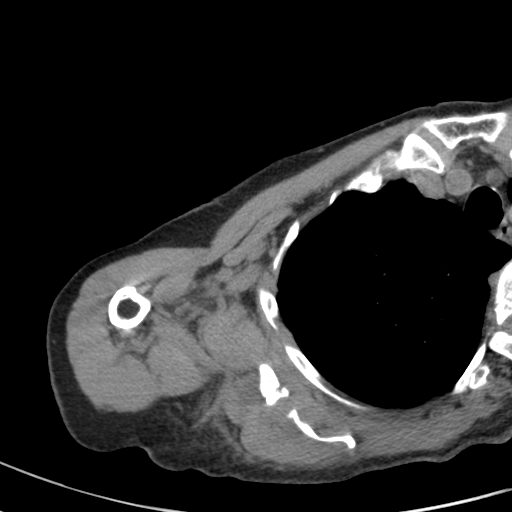
[im 62/93  soft-tissue]
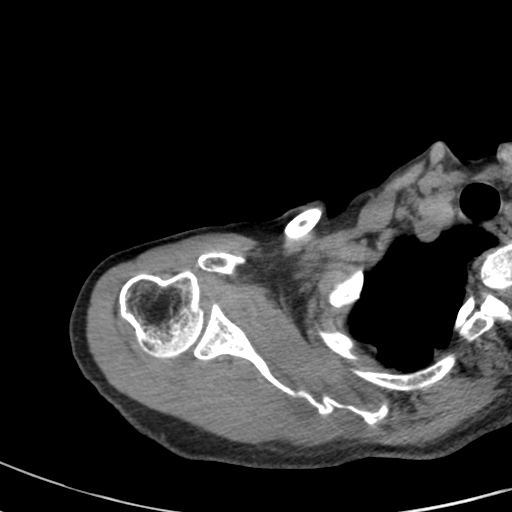
[im 77/93  soft-tissue]
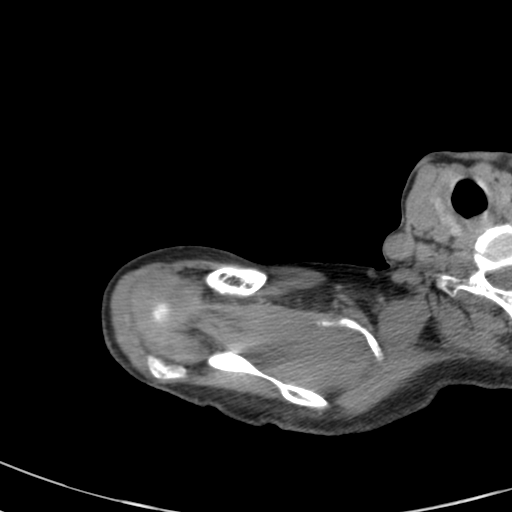

[Series 602: axial bone · axial · 0.50mm/px · z∈[-187,-71]mm · 5 of 88 slices shown, 7 images]
[im 15/88  soft-tissue]
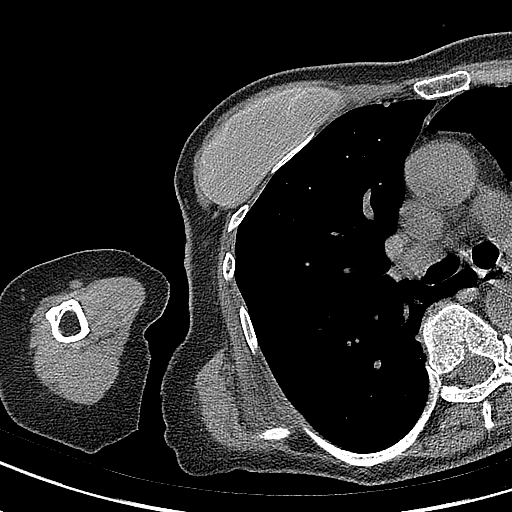
[im 15/88  bone]
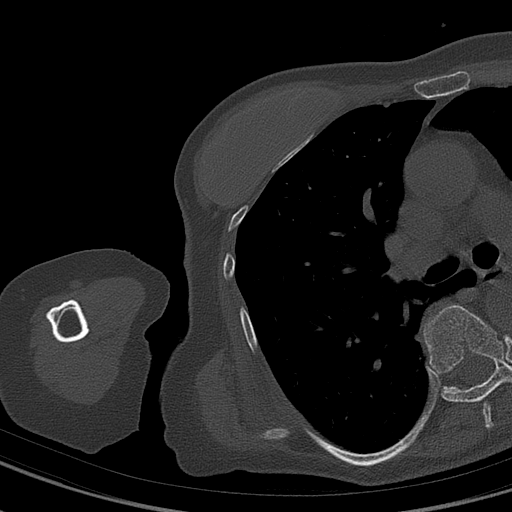
[im 30/88  bone]
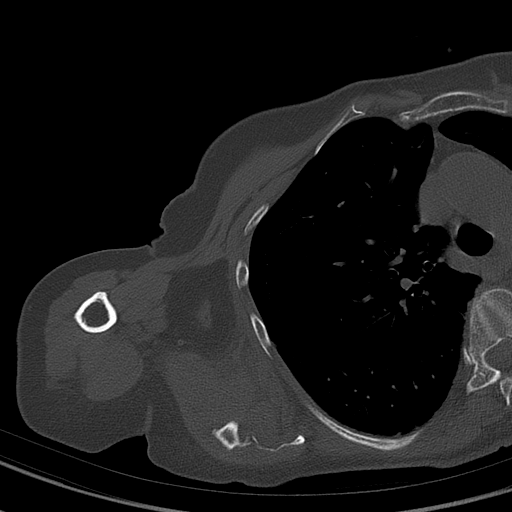
[im 44/88  bone]
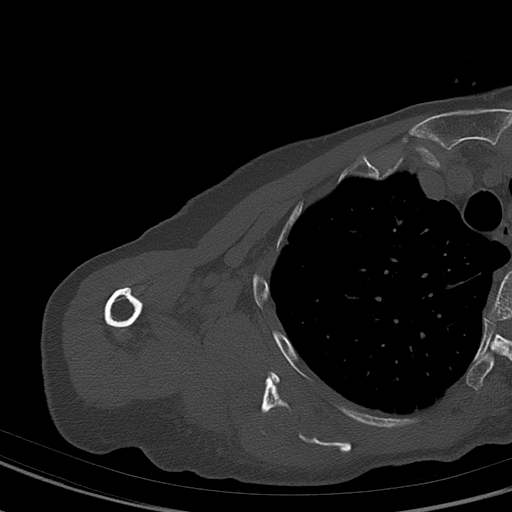
[im 59/88  bone]
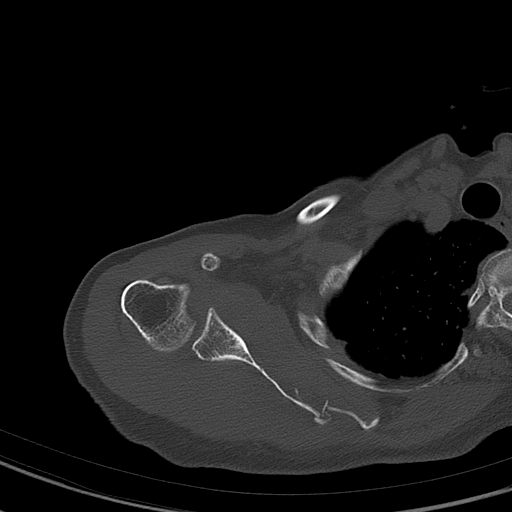
[im 73/88  soft-tissue]
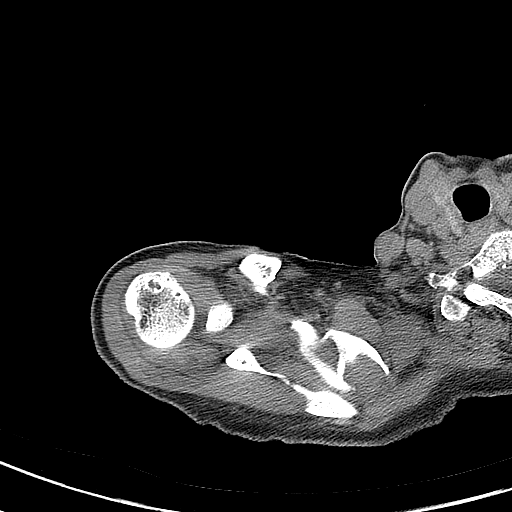
[im 73/88  bone]
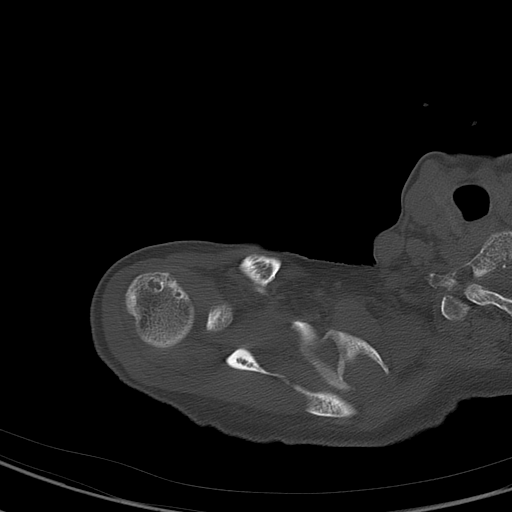

[10 of 14 positions shown; findings below may reference images not displayed]

FINDINGS: Bones/Joint/Cartilage

Acute highly comminuted, displaced, and angulated fracture of the
scapular body extending into the proximal scapular spine (series 8,
image 97). No extension into the glenoid. No additional fracture. No
dislocation.

Mild degenerative changes of the acromioclavicular joint. Reactive
subcortical cystic changes in the greater tuberosity. No joint
effusion.

Ligaments

Ligaments are suboptimally evaluated by CT.

Muscles and Tendons
Grossly intact.  No muscle atrophy.

Soft tissue
No fluid collection or hematoma.  No soft tissue mass.

Prior right mastectomy with implant reconstruction.
IMPRESSION: 1. Acute highly comminuted, displaced, and angulated fracture of the
scapular body extending into the proximal scapular spine. No
extension into the glenoid.

## 2022-07-01 DIAGNOSIS — Z13220 Encounter for screening for lipoid disorders: Secondary | ICD-10-CM | POA: Diagnosis not present

## 2022-07-01 DIAGNOSIS — Z136 Encounter for screening for cardiovascular disorders: Secondary | ICD-10-CM | POA: Diagnosis not present

## 2022-07-01 DIAGNOSIS — Z0000 Encounter for general adult medical examination without abnormal findings: Secondary | ICD-10-CM | POA: Diagnosis not present

## 2022-07-11 DIAGNOSIS — Z0000 Encounter for general adult medical examination without abnormal findings: Secondary | ICD-10-CM | POA: Diagnosis not present

## 2022-07-11 DIAGNOSIS — K219 Gastro-esophageal reflux disease without esophagitis: Secondary | ICD-10-CM | POA: Diagnosis not present

## 2022-07-11 DIAGNOSIS — Z6823 Body mass index (BMI) 23.0-23.9, adult: Secondary | ICD-10-CM | POA: Diagnosis not present

## 2022-07-11 DIAGNOSIS — M810 Age-related osteoporosis without current pathological fracture: Secondary | ICD-10-CM | POA: Diagnosis not present

## 2022-07-11 DIAGNOSIS — N182 Chronic kidney disease, stage 2 (mild): Secondary | ICD-10-CM | POA: Diagnosis not present

## 2022-07-11 DIAGNOSIS — E559 Vitamin D deficiency, unspecified: Secondary | ICD-10-CM | POA: Diagnosis not present

## 2022-07-11 DIAGNOSIS — Z853 Personal history of malignant neoplasm of breast: Secondary | ICD-10-CM | POA: Diagnosis not present

## 2022-07-12 ENCOUNTER — Other Ambulatory Visit: Payer: Self-pay | Admitting: Family Medicine

## 2022-07-12 DIAGNOSIS — M810 Age-related osteoporosis without current pathological fracture: Secondary | ICD-10-CM

## 2022-10-18 IMAGING — MG DIGITAL SCREENING UNILAT LEFT W/ TOMO W/ CAD
4 series · 4 of 12 positions shown · non-contrast
Comparison: Previous exam(s).

CLINICAL DATA: Screening.

EXAM:
DIGITAL SCREENING UNILATERAL LEFT MAMMOGRAM WITH CAD AND
TOMOSYNTHESIS
TECHNIQUE: Left screening digital craniocaudal and mediolateral oblique
mammograms were obtained. Left screening digital breast
tomosynthesis was performed. The images were evaluated with
computer-aided detection.

[L MLO synth-2D]
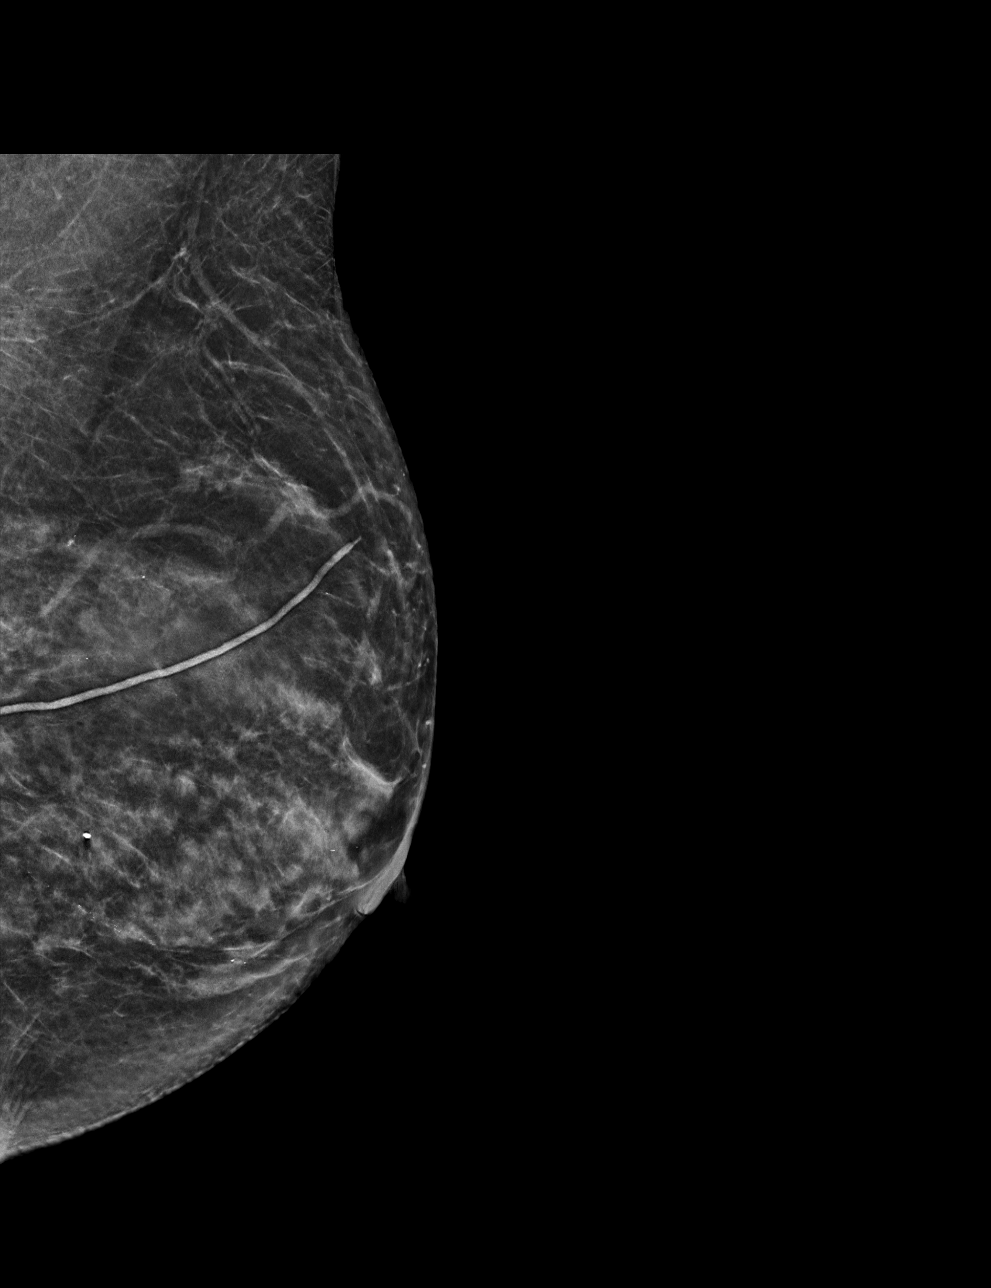

[L CC synth-2D]
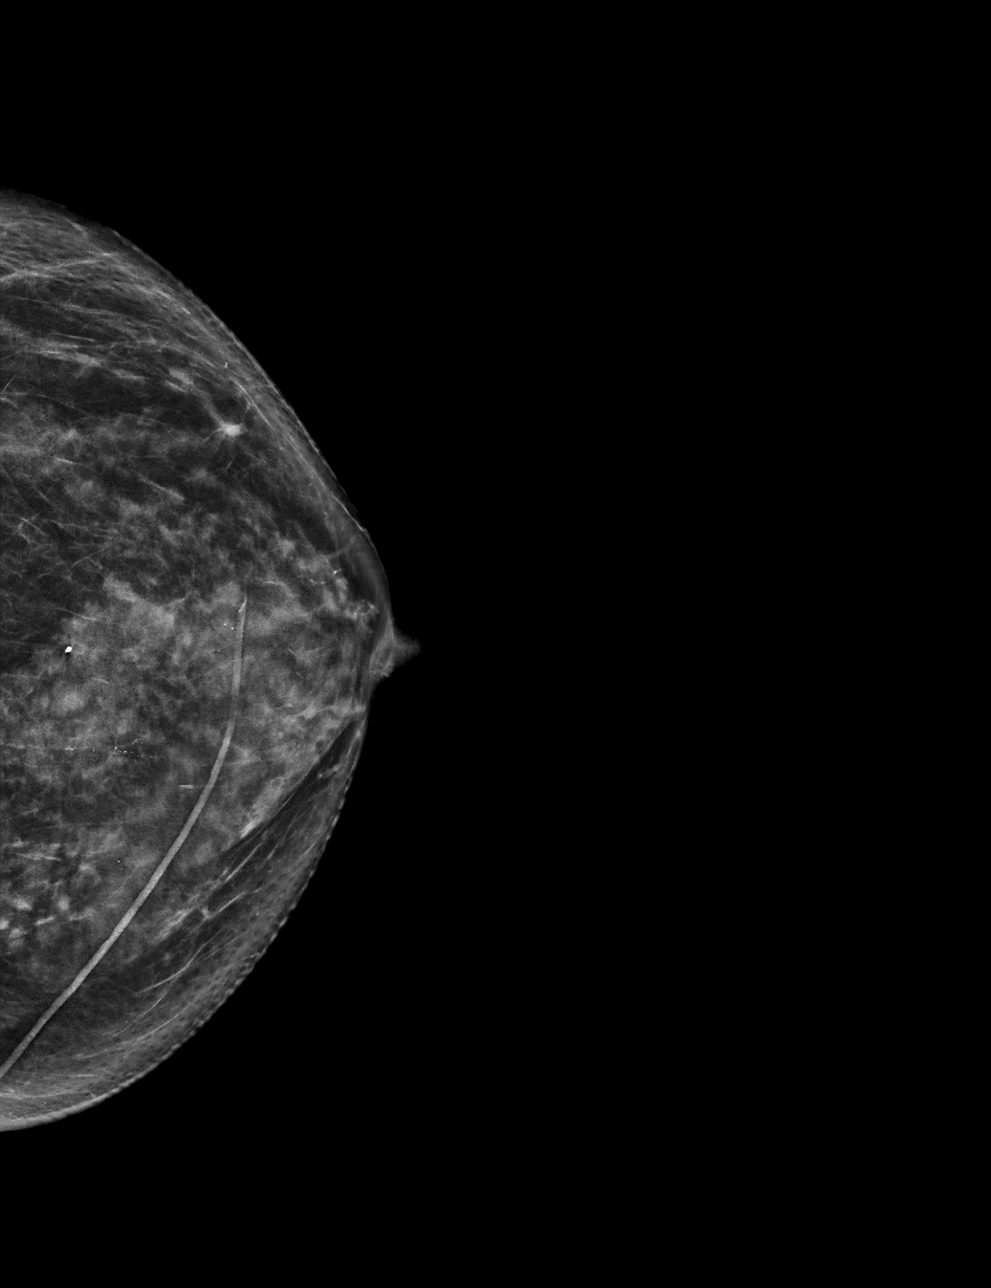

[L MLO tomo · tomo slice 27/54.0]
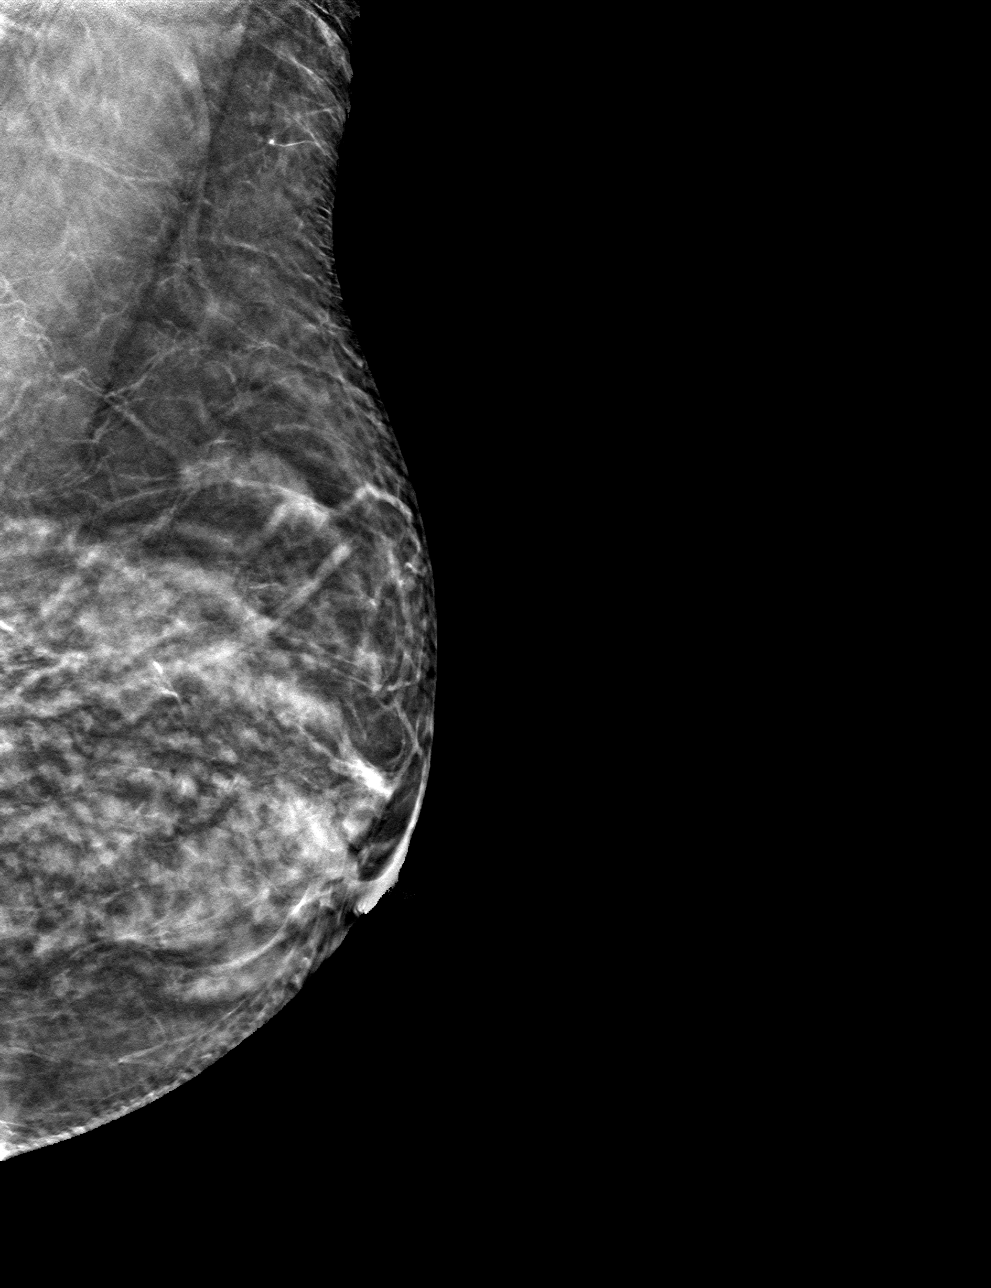

[L CC tomo · tomo slice 31/60.0]
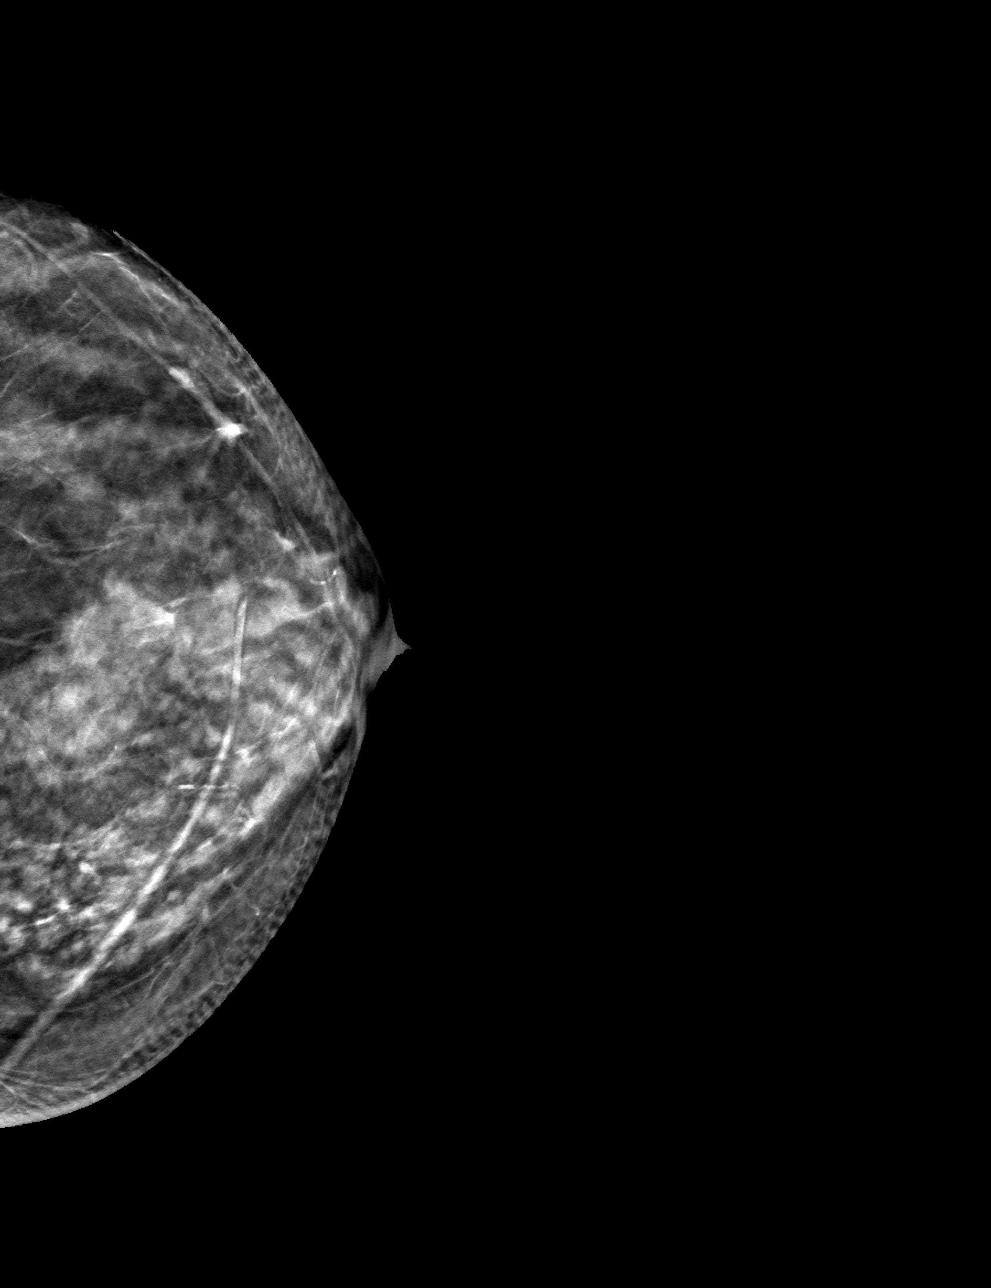

[4 of 12 positions shown; findings below may reference images not displayed]

ACR Breast Density Category c: The breast tissue is heterogeneously
dense, which may obscure small masses.
FINDINGS: There are no findings suspicious for malignancy.
IMPRESSION: No mammographic evidence of malignancy. A result letter of this
screening mammogram will be mailed directly to the patient.

RECOMMENDATION:
Screening mammogram in one year. (Code:R8-2-3E0)

BI-RADS CATEGORY  1: Negative.

## 2022-12-29 ENCOUNTER — Ambulatory Visit
Admission: RE | Admit: 2022-12-29 | Discharge: 2022-12-29 | Disposition: A | Discharge status: Home / Self Care | Payer: Medicare PPO | Source: Ambulatory Visit | Attending: Family Medicine | Admitting: Family Medicine

## 2022-12-29 DIAGNOSIS — Z780 Asymptomatic menopausal state: Secondary | ICD-10-CM | POA: Diagnosis not present

## 2022-12-29 DIAGNOSIS — M810 Age-related osteoporosis without current pathological fracture: Secondary | ICD-10-CM

## 2022-12-29 DIAGNOSIS — M8589 Other specified disorders of bone density and structure, multiple sites: Secondary | ICD-10-CM | POA: Diagnosis not present

## 2023-01-02 DIAGNOSIS — L255 Unspecified contact dermatitis due to plants, except food: Secondary | ICD-10-CM | POA: Diagnosis not present

## 2023-01-03 DIAGNOSIS — Z6822 Body mass index (BMI) 22.0-22.9, adult: Secondary | ICD-10-CM | POA: Diagnosis not present

## 2023-01-03 DIAGNOSIS — L255 Unspecified contact dermatitis due to plants, except food: Secondary | ICD-10-CM | POA: Diagnosis not present

## 2023-01-25 ENCOUNTER — Other Ambulatory Visit: Payer: Self-pay | Admitting: Family Medicine

## 2023-01-25 DIAGNOSIS — Z1231 Encounter for screening mammogram for malignant neoplasm of breast: Secondary | ICD-10-CM

## 2023-01-26 ENCOUNTER — Ambulatory Visit: Payer: Medicare PPO | Admitting: Physician Assistant

## 2023-01-31 DIAGNOSIS — D1801 Hemangioma of skin and subcutaneous tissue: Secondary | ICD-10-CM | POA: Diagnosis not present

## 2023-01-31 DIAGNOSIS — L568 Other specified acute skin changes due to ultraviolet radiation: Secondary | ICD-10-CM | POA: Diagnosis not present

## 2023-01-31 DIAGNOSIS — L570 Actinic keratosis: Secondary | ICD-10-CM | POA: Diagnosis not present

## 2023-01-31 DIAGNOSIS — Z08 Encounter for follow-up examination after completed treatment for malignant neoplasm: Secondary | ICD-10-CM | POA: Diagnosis not present

## 2023-01-31 DIAGNOSIS — Z85820 Personal history of malignant melanoma of skin: Secondary | ICD-10-CM | POA: Diagnosis not present

## 2023-01-31 DIAGNOSIS — L821 Other seborrheic keratosis: Secondary | ICD-10-CM | POA: Diagnosis not present

## 2023-01-31 DIAGNOSIS — Z1283 Encounter for screening for malignant neoplasm of skin: Secondary | ICD-10-CM | POA: Diagnosis not present

## 2023-03-17 DIAGNOSIS — Z961 Presence of intraocular lens: Secondary | ICD-10-CM | POA: Diagnosis not present

## 2023-03-17 DIAGNOSIS — H35373 Puckering of macula, bilateral: Secondary | ICD-10-CM | POA: Diagnosis not present

## 2023-03-28 ENCOUNTER — Ambulatory Visit
Admission: RE | Admit: 2023-03-28 | Discharge: 2023-03-28 | Disposition: A | Discharge status: Home / Self Care | Payer: Medicare PPO | Source: Ambulatory Visit | Attending: Family Medicine | Admitting: Family Medicine

## 2023-03-28 DIAGNOSIS — Z1231 Encounter for screening mammogram for malignant neoplasm of breast: Secondary | ICD-10-CM | POA: Diagnosis not present

## 2023-07-14 DIAGNOSIS — Z136 Encounter for screening for cardiovascular disorders: Secondary | ICD-10-CM | POA: Diagnosis not present

## 2023-07-14 DIAGNOSIS — Z0000 Encounter for general adult medical examination without abnormal findings: Secondary | ICD-10-CM | POA: Diagnosis not present

## 2023-07-14 DIAGNOSIS — N182 Chronic kidney disease, stage 2 (mild): Secondary | ICD-10-CM | POA: Diagnosis not present

## 2023-07-14 DIAGNOSIS — Z853 Personal history of malignant neoplasm of breast: Secondary | ICD-10-CM | POA: Diagnosis not present

## 2023-07-14 DIAGNOSIS — Z126 Encounter for screening for malignant neoplasm of bladder: Secondary | ICD-10-CM | POA: Diagnosis not present

## 2023-07-14 DIAGNOSIS — K219 Gastro-esophageal reflux disease without esophagitis: Secondary | ICD-10-CM | POA: Diagnosis not present

## 2023-07-14 DIAGNOSIS — E559 Vitamin D deficiency, unspecified: Secondary | ICD-10-CM | POA: Diagnosis not present

## 2023-07-14 DIAGNOSIS — M810 Age-related osteoporosis without current pathological fracture: Secondary | ICD-10-CM | POA: Diagnosis not present

## 2023-11-28 DIAGNOSIS — H1031 Unspecified acute conjunctivitis, right eye: Secondary | ICD-10-CM | POA: Diagnosis not present

## 2023-12-19 DIAGNOSIS — Z860100 Personal history of colon polyps, unspecified: Secondary | ICD-10-CM | POA: Diagnosis not present

## 2023-12-19 DIAGNOSIS — K5730 Diverticulosis of large intestine without perforation or abscess without bleeding: Secondary | ICD-10-CM | POA: Diagnosis not present

## 2023-12-19 DIAGNOSIS — Z09 Encounter for follow-up examination after completed treatment for conditions other than malignant neoplasm: Secondary | ICD-10-CM | POA: Diagnosis not present

## 2024-02-06 DIAGNOSIS — L821 Other seborrheic keratosis: Secondary | ICD-10-CM | POA: Diagnosis not present

## 2024-02-06 DIAGNOSIS — Z85828 Personal history of other malignant neoplasm of skin: Secondary | ICD-10-CM | POA: Diagnosis not present

## 2024-02-06 DIAGNOSIS — L578 Other skin changes due to chronic exposure to nonionizing radiation: Secondary | ICD-10-CM | POA: Diagnosis not present

## 2024-02-06 DIAGNOSIS — Z85820 Personal history of malignant melanoma of skin: Secondary | ICD-10-CM | POA: Diagnosis not present

## 2024-02-06 DIAGNOSIS — D225 Melanocytic nevi of trunk: Secondary | ICD-10-CM | POA: Diagnosis not present

## 2024-02-06 DIAGNOSIS — L814 Other melanin hyperpigmentation: Secondary | ICD-10-CM | POA: Diagnosis not present

## 2024-02-06 DIAGNOSIS — L720 Epidermal cyst: Secondary | ICD-10-CM | POA: Diagnosis not present

## 2024-02-06 DIAGNOSIS — Z08 Encounter for follow-up examination after completed treatment for malignant neoplasm: Secondary | ICD-10-CM | POA: Diagnosis not present

## 2024-02-13 ENCOUNTER — Other Ambulatory Visit: Payer: Self-pay | Admitting: Family Medicine

## 2024-02-13 DIAGNOSIS — Z1231 Encounter for screening mammogram for malignant neoplasm of breast: Secondary | ICD-10-CM

## 2024-03-19 DIAGNOSIS — H35371 Puckering of macula, right eye: Secondary | ICD-10-CM | POA: Diagnosis not present

## 2024-03-19 DIAGNOSIS — H40013 Open angle with borderline findings, low risk, bilateral: Secondary | ICD-10-CM | POA: Diagnosis not present

## 2024-03-19 DIAGNOSIS — H524 Presbyopia: Secondary | ICD-10-CM | POA: Diagnosis not present

## 2024-04-16 ENCOUNTER — Ambulatory Visit
Admission: RE | Admit: 2024-04-16 | Discharge: 2024-04-16 | Disposition: A | Discharge status: Home / Self Care | Source: Ambulatory Visit | Attending: Family Medicine | Admitting: Family Medicine

## 2024-04-16 DIAGNOSIS — Z1231 Encounter for screening mammogram for malignant neoplasm of breast: Secondary | ICD-10-CM | POA: Diagnosis not present

## 2024-06-22 DIAGNOSIS — R399 Unspecified symptoms and signs involving the genitourinary system: Secondary | ICD-10-CM | POA: Diagnosis not present

## 2024-06-22 DIAGNOSIS — N390 Urinary tract infection, site not specified: Secondary | ICD-10-CM | POA: Diagnosis not present

## 2024-07-26 DIAGNOSIS — K219 Gastro-esophageal reflux disease without esophagitis: Secondary | ICD-10-CM | POA: Diagnosis not present

## 2024-07-26 DIAGNOSIS — Z853 Personal history of malignant neoplasm of breast: Secondary | ICD-10-CM | POA: Diagnosis not present

## 2024-07-26 DIAGNOSIS — E559 Vitamin D deficiency, unspecified: Secondary | ICD-10-CM | POA: Diagnosis not present

## 2024-07-26 DIAGNOSIS — Z0000 Encounter for general adult medical examination without abnormal findings: Secondary | ICD-10-CM | POA: Diagnosis not present

## 2024-07-26 DIAGNOSIS — Z1331 Encounter for screening for depression: Secondary | ICD-10-CM | POA: Diagnosis not present

## 2024-07-26 DIAGNOSIS — N182 Chronic kidney disease, stage 2 (mild): Secondary | ICD-10-CM | POA: Diagnosis not present

## 2024-07-26 DIAGNOSIS — Z136 Encounter for screening for cardiovascular disorders: Secondary | ICD-10-CM | POA: Diagnosis not present

## 2024-07-26 DIAGNOSIS — M810 Age-related osteoporosis without current pathological fracture: Secondary | ICD-10-CM | POA: Diagnosis not present
